# Patient Record
Sex: Female | Born: 1975 | Race: White | Hispanic: No | Marital: Married | Smoking: Never smoker
Health system: Southern US, Community
[De-identification: ages and names within clinical notes are randomized; demographics above are authoritative.]

## PROBLEM LIST (undated history)

## (undated) DIAGNOSIS — N390 Urinary tract infection, site not specified: Secondary | ICD-10-CM

## (undated) DIAGNOSIS — E079 Disorder of thyroid, unspecified: Secondary | ICD-10-CM

## (undated) HISTORY — DX: Disorder of thyroid, unspecified: E07.9

---

## 1998-11-25 ENCOUNTER — Other Ambulatory Visit: Admission: RE | Admit: 1998-11-25 | Discharge: 1998-11-25 | Payer: Self-pay | Admitting: Gynecology

## 1999-07-22 ENCOUNTER — Other Ambulatory Visit: Admission: RE | Admit: 1999-07-22 | Discharge: 1999-07-22 | Payer: Self-pay | Admitting: Obstetrics and Gynecology

## 2000-05-19 ENCOUNTER — Ambulatory Visit (HOSPITAL_COMMUNITY): Admission: RE | Admit: 2000-05-19 | Discharge: 2000-05-19 | Payer: Self-pay | Admitting: Obstetrics and Gynecology

## 2000-05-19 ENCOUNTER — Encounter: Payer: Self-pay | Admitting: Obstetrics and Gynecology

## 2000-07-21 ENCOUNTER — Ambulatory Visit (HOSPITAL_COMMUNITY): Admission: RE | Admit: 2000-07-21 | Discharge: 2000-07-21 | Payer: Self-pay | Admitting: Obstetrics and Gynecology

## 2000-08-14 ENCOUNTER — Encounter: Payer: Self-pay | Admitting: Obstetrics and Gynecology

## 2000-08-14 ENCOUNTER — Ambulatory Visit (HOSPITAL_COMMUNITY): Admission: RE | Admit: 2000-08-14 | Discharge: 2000-08-14 | Payer: Self-pay | Admitting: Obstetrics and Gynecology

## 2000-10-02 ENCOUNTER — Inpatient Hospital Stay (HOSPITAL_COMMUNITY): Admission: AD | Admit: 2000-10-02 | Discharge: 2000-10-02 | Payer: Self-pay | Admitting: Obstetrics and Gynecology

## 2000-10-03 ENCOUNTER — Inpatient Hospital Stay (HOSPITAL_COMMUNITY): Admission: AD | Admit: 2000-10-03 | Discharge: 2000-10-06 | Payer: Self-pay | Admitting: Obstetrics and Gynecology

## 2000-10-08 ENCOUNTER — Encounter: Admission: RE | Admit: 2000-10-08 | Discharge: 2000-11-09 | Payer: Self-pay | Admitting: Obstetrics and Gynecology

## 2000-11-13 ENCOUNTER — Other Ambulatory Visit: Admission: RE | Admit: 2000-11-13 | Discharge: 2000-11-13 | Payer: Self-pay | Admitting: Obstetrics and Gynecology

## 2001-11-28 ENCOUNTER — Other Ambulatory Visit: Admission: RE | Admit: 2001-11-28 | Discharge: 2001-11-28 | Payer: Self-pay | Admitting: Obstetrics and Gynecology

## 2002-12-31 ENCOUNTER — Other Ambulatory Visit: Admission: RE | Admit: 2002-12-31 | Discharge: 2002-12-31 | Payer: Self-pay | Admitting: Obstetrics and Gynecology

## 2003-04-11 ENCOUNTER — Ambulatory Visit (HOSPITAL_COMMUNITY): Admission: RE | Admit: 2003-04-11 | Discharge: 2003-04-11 | Payer: Self-pay | Admitting: Obstetrics and Gynecology

## 2003-04-11 ENCOUNTER — Encounter: Payer: Self-pay | Admitting: Obstetrics and Gynecology

## 2003-08-19 ENCOUNTER — Inpatient Hospital Stay (HOSPITAL_COMMUNITY): Admission: AD | Admit: 2003-08-19 | Discharge: 2003-08-21 | Payer: Self-pay | Admitting: Obstetrics and Gynecology

## 2004-06-03 ENCOUNTER — Other Ambulatory Visit: Admission: RE | Admit: 2004-06-03 | Discharge: 2004-06-03 | Payer: Self-pay | Admitting: Obstetrics and Gynecology

## 2005-08-10 ENCOUNTER — Other Ambulatory Visit: Admission: RE | Admit: 2005-08-10 | Discharge: 2005-08-10 | Payer: Self-pay | Admitting: Obstetrics and Gynecology

## 2006-01-19 ENCOUNTER — Inpatient Hospital Stay (HOSPITAL_COMMUNITY): Admission: AD | Admit: 2006-01-19 | Discharge: 2006-01-19 | Payer: Self-pay | Admitting: Obstetrics and Gynecology

## 2006-01-25 ENCOUNTER — Ambulatory Visit (HOSPITAL_COMMUNITY): Admission: RE | Admit: 2006-01-25 | Discharge: 2006-01-25 | Payer: Self-pay | Admitting: Obstetrics and Gynecology

## 2006-02-16 ENCOUNTER — Inpatient Hospital Stay (HOSPITAL_COMMUNITY): Admission: AD | Admit: 2006-02-16 | Discharge: 2006-02-18 | Payer: Self-pay | Admitting: Obstetrics and Gynecology

## 2013-02-11 ENCOUNTER — Other Ambulatory Visit: Payer: Self-pay

## 2013-02-11 MED ORDER — LEVOTHYROXINE SODIUM 112 MCG PO TABS: 112.0000 ug | ORAL_TABLET | Freq: Every day | ORAL | Status: DC

## 2013-02-11 NOTE — Telephone Encounter (Signed)
No thyroid labs in our chart  Last seen 12/13

## 2013-09-12 ENCOUNTER — Encounter: Payer: Self-pay | Admitting: Nurse Practitioner

## 2013-09-12 ENCOUNTER — Ambulatory Visit (INDEPENDENT_AMBULATORY_CARE_PROVIDER_SITE_OTHER): Payer: BC Managed Care – PPO | Admitting: Nurse Practitioner

## 2013-09-12 ENCOUNTER — Encounter (INDEPENDENT_AMBULATORY_CARE_PROVIDER_SITE_OTHER): Payer: Self-pay

## 2013-09-12 ENCOUNTER — Telehealth: Payer: Self-pay | Admitting: Nurse Practitioner

## 2013-09-12 VITALS — BP 151/89 | HR 87 | Temp 97.9°F | Ht 64.0 in | Wt 177.0 lb

## 2013-09-12 DIAGNOSIS — J209 Acute bronchitis, unspecified: Secondary | ICD-10-CM

## 2013-09-12 MED ORDER — HYDROCODONE-HOMATROPINE 5-1.5 MG/5ML PO SYRP: 5.0000 mL | ORAL_SOLUTION | Freq: Three times a day (TID) | ORAL | Status: DC | PRN

## 2013-09-12 MED ORDER — AZITHROMYCIN 250 MG PO TABS: ORAL_TABLET | ORAL | Status: DC

## 2013-09-12 NOTE — Patient Instructions (Signed)

## 2013-09-12 NOTE — Telephone Encounter (Signed)
Appt scheduled for afternoon clinic.

## 2013-09-12 NOTE — Progress Notes (Signed)
   Subjective:    Patient ID: Brooke Schroeder, female    DOB: [birth year], 37 y.o.   MRN: [id number]  HPI Patient in tonight c/o cough and congestion- started about 2 weeks ago- cough no better- OTC delsm and allegra d-    Review of Systems  Constitutional: Negative for fever, chills, appetite change and fatigue.  HENT: Positive for congestion, rhinorrhea and sinus pressure.   Respiratory: Positive for cough (nonproductive).   Cardiovascular: Negative.   All other systems reviewed and are negative.       Objective:   Physical Exam  Constitutional: She is oriented to person, place, and time. She appears well-developed and well-nourished.  HENT:  Right Ear: Hearing, tympanic membrane, external ear and ear canal normal.  Left Ear: Hearing, tympanic membrane, external ear and ear canal normal.  Nose: Mucosal edema and rhinorrhea present. Right sinus exhibits no maxillary sinus tenderness and no frontal sinus tenderness. Left sinus exhibits no frontal sinus tenderness.  Mouth/Throat: Oropharynx is clear and moist and mucous membranes are normal.  Cardiovascular: Normal rate, regular rhythm and normal heart sounds.   Pulmonary/Chest: Effort normal and breath sounds normal.  Neurological: She is alert and oriented to person, place, and time.  Skin: Skin is warm and dry.    BP 151/89  Pulse 87  Temp(Src) 97.9 F (36.6 C) (Oral)  Ht 5\' 4"  (1.626 m)  Wt 177 lb (80.287 kg)  BMI 30.37 kg/m2  LMP 09/12/2013       Assessment & Plan:   1. Acute bronchitis    Meds ordered this encounter  Medications  . azithromycin (ZITHROMAX Z-PAK) 250 MG tablet    Sig: As directed    Dispense:  6 each    Refill:  0    Order Specific Question:  Supervising Provider    Answer:  Ernestina Penna [[id number]]  . HYDROcodone-homatropine (HYCODAN) 5-1.5 MG/5ML syrup    Sig: Take 5 mLs by mouth every 8 (eight) hours as needed for cough.    Dispense:  120 mL    Refill:  0    Order Specific Question:   Supervising Provider    Answer:  Ernestina Penna [[id number]]   1. Take meds as prescribed 2. Use a cool mist humidifier especially during the winter months and when heat has been humid. 3. Use saline nose sprays frequently 4. Saline irrigations of the nose can be very helpful if done frequently.  * 4X daily for 1 week*  * Use of a nettie pot can be helpful with this. Follow directions with this* 5. Drink plenty of fluids 6. Keep thermostat turn down low 7.For any cough or congestion  Use plain Mucinex- regular strength or max strength is fine   * Children- consult with Pharmacist for dosing 8. For fever or aces or pains- take tylenol or ibuprofen appropriate for age and weight.  * for fevers greater than 101 orally you may alternate ibuprofen and tylenol every  3 hours. AVOID DECONGESTANTS OTC due to blood pressure!!!   Brooke Daphine Deutscher, FNP

## 2013-10-17 ENCOUNTER — Encounter: Payer: Self-pay | Admitting: Family Medicine

## 2013-10-17 ENCOUNTER — Ambulatory Visit (INDEPENDENT_AMBULATORY_CARE_PROVIDER_SITE_OTHER): Payer: BC Managed Care – PPO | Admitting: Family Medicine

## 2013-10-17 VITALS — BP 140/91 | HR 89 | Temp 98.6°F | Ht 64.0 in | Wt 177.4 lb

## 2013-10-17 DIAGNOSIS — J111 Influenza due to unidentified influenza virus with other respiratory manifestations: Secondary | ICD-10-CM

## 2013-10-17 MED ORDER — AZITHROMYCIN 250 MG PO TABS: ORAL_TABLET | ORAL | Status: DC

## 2013-10-17 MED ORDER — OSELTAMIVIR PHOSPHATE 75 MG PO CAPS: 75.0000 mg | ORAL_CAPSULE | Freq: Two times a day (BID) | ORAL | Status: DC

## 2013-10-17 MED ORDER — HYDROCODONE-HOMATROPINE 5-1.5 MG/5ML PO SYRP: 5.0000 mL | ORAL_SOLUTION | Freq: Three times a day (TID) | ORAL | Status: DC | PRN

## 2013-10-17 NOTE — Progress Notes (Signed)
   Subjective:    Patient ID: Katha HammingStaci B Haris, female    DOB: 1976/03/09, 38 y.o.   MRN: [id number]  HPI This 38 y.o. female presents for evaluation of URI sx's, arthralgias, myalgias, fever, and malaise.   Review of Systems C/o uri sx's, arthralgias, myalgias, fever, and malaise. No chest pain, SOB, HA, dizziness, vision change, N/V, diarrhea, constipation, dysuria, urinary urgency or frequency, myalgias, arthralgias or rash.     Objective:   Physical Exam Vital signs noted  Well developed well nourished female.  HEENT - Head atraumatic Normocephalic                Eyes - PERRLA, Conjuctiva - clear Sclera- Clear EOMI                Ears - EAC's Wnl TM's Wnl Gross Hearing WNL                Nose - Nares patent                 Throat - oropharanx wnl Respiratory - Lungs CTA bilateral Cardiac - RRR S1 and S2 without murmur GI - Abdomen soft Nontender and bowel sounds active x 4 Extremities - No edema. Neuro - Grossly intact.       Assessment & Plan:  Influenza - Plan: azithromycin (ZITHROMAX Z-PAK) 250 MG tablet, HYDROcodone-homatropine (HYCODAN) 5-1.5 MG/5ML syrup, oseltamivir (TAMIFLU) 75 MG capsule  Push po fluids, rest, tylenol and motrin otc prn as directed for fever, arthralgias, and myalgias.  Follow up prn if sx's continue or persist.  Deatra CanterWilliam J Afrika Brick FNP

## 2014-01-03 ENCOUNTER — Telehealth: Payer: Self-pay | Admitting: Nurse Practitioner

## 2014-01-03 NOTE — Telephone Encounter (Signed)
No appointments available today. Patient is ok with scheduling for tomorrow morning. She wasn't available when I returned her call but her husband relayed this information. He will tell her the appointment time and she will call back if this doesn't work for her.

## 2014-01-04 ENCOUNTER — Ambulatory Visit (INDEPENDENT_AMBULATORY_CARE_PROVIDER_SITE_OTHER): Payer: BC Managed Care – PPO | Admitting: Nurse Practitioner

## 2014-01-04 VITALS — BP 137/84 | HR 76 | Temp 97.0°F | Ht 64.0 in | Wt 178.0 lb

## 2014-01-04 DIAGNOSIS — N390 Urinary tract infection, site not specified: Secondary | ICD-10-CM

## 2014-01-04 DIAGNOSIS — R300 Dysuria: Secondary | ICD-10-CM

## 2014-01-04 DIAGNOSIS — R309 Painful micturition, unspecified: Secondary | ICD-10-CM

## 2014-01-04 LAB — POCT UA - MICROSCOPIC ONLY
CRYSTALS, UR, HPF, POC: NEGATIVE
Casts, Ur, LPF, POC: NEGATIVE
MUCUS UA: NEGATIVE
Yeast, UA: NEGATIVE

## 2014-01-04 LAB — POCT URINALYSIS DIPSTICK
Bilirubin, UA: NEGATIVE
GLUCOSE UA: NEGATIVE
Ketones, UA: NEGATIVE
Nitrite, UA: NEGATIVE
PROTEIN UA: NEGATIVE
SPEC GRAV UA: 1.015
Urobilinogen, UA: NEGATIVE
pH, UA: 6.5

## 2014-01-04 MED ORDER — NITROFURANTOIN MONOHYD MACRO 100 MG PO CAPS: 100.0000 mg | ORAL_CAPSULE | Freq: Two times a day (BID) | ORAL | Status: DC

## 2014-01-04 NOTE — Progress Notes (Signed)
   Subjective:    Patient ID: Brooke Schroeder, female    DOB: April 30, 1976, 38 y.o.   MKatha HammingN: [id number]  HPI Patient in c/o dysuria and frequency- had to get up several times in the middle of night to void.- started Thursday and has worsened.    Review of Systems  Constitutional: Negative for fever and chills.  Respiratory: Negative.   Cardiovascular: Negative.   Genitourinary: Positive for dysuria, urgency and frequency.  All other systems reviewed and are negative.      Objective:   Physical Exam  Constitutional: She appears well-developed and well-nourished.  Cardiovascular: Normal rate, regular rhythm and normal heart sounds.   Pulmonary/Chest: Effort normal and breath sounds normal.  Abdominal: Soft. Bowel sounds are normal. There is no tenderness.  Genitourinary:  No CAV tenderness bil  Skin: Skin is warm.  Psychiatric: She has a normal mood and affect. Her behavior is normal. Judgment and thought content normal.   BP 137/84  Pulse 76  Temp(Src) 97 F (36.1 C) (Oral)  Ht 5\' 4"  (1.626 m)  Wt 178 lb (80.74 kg)  BMI 30.54 kg/m2  LMP 12/13/2013        Assessment & Plan:   1. Painful urination   2. UTI (urinary tract infection)    Meds ordered this encounter  Medications  . nitrofurantoin, macrocrystal-monohydrate, (MACROBID) 100 MG capsule    Sig: Take 1 capsule (100 mg total) by mouth 2 (two) times daily.    Dispense:  14 capsule    Refill:  0    Order Specific Question:  Supervising Provider    Answer:  Deborra MedinaMOORE, DONALD W [[id number]]   Force fluids AZO over the counter X2 days RTO prn Culture pending  Mary-Margaret Daphine DeutscherMartin, FNP

## 2014-01-04 NOTE — Patient Instructions (Signed)
Urinary Tract Infection  Urinary tract infections (UTIs) can develop anywhere along your urinary tract. Your urinary tract is your body's drainage system for removing wastes and extra water. Your urinary tract includes two kidneys, two ureters, a bladder, and a urethra. Your kidneys are a pair of bean-shaped organs. Each kidney is about the size of your fist. They are located below your ribs, one on each side of your spine.  CAUSES  Infections are caused by microbes, which are microscopic organisms, including fungi, viruses, and bacteria. These organisms are so small that they can only be seen through a microscope. Bacteria are the microbes that most commonly cause UTIs.  SYMPTOMS   Symptoms of UTIs may vary by age and gender of the patient and by the location of the infection. Symptoms in young women typically include a frequent and intense urge to urinate and a painful, burning feeling in the bladder or urethra during urination. Older women and men are more likely to be tired, shaky, and weak and have muscle aches and abdominal pain. A fever may mean the infection is in your kidneys. Other symptoms of a kidney infection include pain in your back or sides below the ribs, nausea, and vomiting.  DIAGNOSIS  To diagnose a UTI, your caregiver will ask you about your symptoms. Your caregiver also will ask to provide a urine sample. The urine sample will be tested for bacteria and white blood cells. White blood cells are made by your body to help fight infection.  TREATMENT   Typically, UTIs can be treated with medication. Because most UTIs are caused by a bacterial infection, they usually can be treated with the use of antibiotics. The choice of antibiotic and length of treatment depend on your symptoms and the type of bacteria causing your infection.  HOME CARE INSTRUCTIONS   If you were prescribed antibiotics, take them exactly as your caregiver instructs you. Finish the medication even if you feel better after you  have only taken some of the medication.   Drink enough water and fluids to keep your urine clear or pale yellow.   Avoid caffeine, tea, and carbonated beverages. They tend to irritate your bladder.   Empty your bladder often. Avoid holding urine for long periods of time.   Empty your bladder before and after sexual intercourse.   After a bowel movement, women should cleanse from front to back. Use each tissue only once.  SEEK MEDICAL CARE IF:    You have back pain.   You develop a fever.   Your symptoms do not begin to resolve within 3 days.  SEEK IMMEDIATE MEDICAL CARE IF:    You have severe back pain or lower abdominal pain.   You develop chills.   You have nausea or vomiting.   You have continued burning or discomfort with urination.  MAKE SURE YOU:    Understand these instructions.   Will watch your condition.   Will get help right away if you are not doing well or get worse.  Document Released: 06/22/2005 Document Revised: 03/13/2012 Document Reviewed: 10/21/2011  [employer] Patient Information 2014 [employer].

## 2014-06-21 ENCOUNTER — Ambulatory Visit (INDEPENDENT_AMBULATORY_CARE_PROVIDER_SITE_OTHER): Payer: BC Managed Care – PPO | Admitting: Family Medicine

## 2014-06-21 VITALS — BP 133/87 | HR 86 | Temp 97.5°F | Ht 64.0 in | Wt 180.2 lb

## 2014-06-21 DIAGNOSIS — H109 Unspecified conjunctivitis: Secondary | ICD-10-CM

## 2014-06-21 MED ORDER — SULFACETAMIDE SODIUM 10 % OP SOLN: 1.0000 [drp] | OPHTHALMIC | Status: DC

## 2014-06-21 NOTE — Progress Notes (Signed)
   Subjective:    Patient ID: Brooke Schroeder, female    DOB: Mar 03, 1976, 37 y.o.   MRN: [id number]  HPI  C/o bilateral eye drainage and itching.  Review of Systems No chest pain, SOB, HA, dizziness, vision change, N/V, diarrhea, constipation, dysuria, urinary urgency or frequency, myalgias, arthralgias or rash.     Objective:   Physical Exam  Eyes - bilateral conjuctiva injected and swollen with exudate      Assessment & Plan:  Conjunctivitis, unspecified laterality - Plan: sulfacetamide (BLEPH-10) 10 % ophthalmic solution q3 hours w/a OU  Deatra Canter FNP

## 2014-09-20 ENCOUNTER — Encounter (HOSPITAL_COMMUNITY): Payer: Self-pay | Admitting: Emergency Medicine

## 2014-09-20 DIAGNOSIS — R197 Diarrhea, unspecified: Secondary | ICD-10-CM | POA: Diagnosis not present

## 2014-09-20 DIAGNOSIS — R112 Nausea with vomiting, unspecified: Secondary | ICD-10-CM | POA: Insufficient documentation

## 2014-09-20 DIAGNOSIS — Z87442 Personal history of urinary calculi: Secondary | ICD-10-CM | POA: Diagnosis not present

## 2014-09-20 DIAGNOSIS — E079 Disorder of thyroid, unspecified: Secondary | ICD-10-CM | POA: Insufficient documentation

## 2014-09-20 DIAGNOSIS — Z79899 Other long term (current) drug therapy: Secondary | ICD-10-CM | POA: Diagnosis not present

## 2014-09-20 DIAGNOSIS — Z3202 Encounter for pregnancy test, result negative: Secondary | ICD-10-CM | POA: Insufficient documentation

## 2014-09-20 DIAGNOSIS — R1084 Generalized abdominal pain: Secondary | ICD-10-CM | POA: Insufficient documentation

## 2014-09-20 DIAGNOSIS — M545 Low back pain: Secondary | ICD-10-CM | POA: Insufficient documentation

## 2014-09-20 DIAGNOSIS — R1030 Lower abdominal pain, unspecified: Secondary | ICD-10-CM | POA: Diagnosis present

## 2014-09-20 LAB — COMPREHENSIVE METABOLIC PANEL
ALK PHOS: 80 U/L (ref 39–117)
ALT: 17 U/L (ref 0–35)
ANION GAP: 8 (ref 5–15)
AST: 23 U/L (ref 0–37)
Albumin: 4.2 g/dL (ref 3.5–5.2)
BILIRUBIN TOTAL: 0.6 mg/dL (ref 0.3–1.2)
BUN: 10 mg/dL (ref 6–23)
CO2: 26 mmol/L (ref 19–32)
Calcium: 8.8 mg/dL (ref 8.4–10.5)
Chloride: 103 mEq/L (ref 96–112)
Creatinine, Ser: 0.95 mg/dL (ref 0.50–1.10)
GFR, EST AFRICAN AMERICAN: 86 mL/min — AB (ref >90)
GFR, EST NON AFRICAN AMERICAN: 74 mL/min — AB (ref >90)
GLUCOSE: 147 mg/dL — AB (ref 70–99)
POTASSIUM: 3.8 mmol/L (ref 3.5–5.1)
Sodium: 137 mmol/L (ref 135–145)
TOTAL PROTEIN: 7.3 g/dL (ref 6.0–8.3)

## 2014-09-20 LAB — CBC WITH DIFFERENTIAL/PLATELET
Basophils Absolute: 0 10*3/uL (ref 0.0–0.1)
Basophils Relative: 0 % (ref 0–1)
EOS ABS: 0 10*3/uL (ref 0.0–0.7)
Eosinophils Relative: 0 % (ref 0–5)
HCT: 39.4 % (ref 36.0–46.0)
HEMOGLOBIN: 14 g/dL (ref 12.0–15.0)
LYMPHS ABS: 0.9 10*3/uL (ref 0.7–4.0)
LYMPHS PCT: 16 % (ref 12–46)
MCH: 30.3 pg (ref 26.0–34.0)
MCHC: 35.5 g/dL (ref 30.0–36.0)
MCV: 85.3 fL (ref 78.0–100.0)
MONOS PCT: 8 % (ref 3–12)
Monocytes Absolute: 0.5 10*3/uL (ref 0.1–1.0)
NEUTROS PCT: 76 % (ref 43–77)
Neutro Abs: 4.4 10*3/uL (ref 1.7–7.7)
Platelets: 161 10*3/uL (ref 150–400)
RBC: 4.62 MIL/uL (ref 3.87–5.11)
RDW: 11.7 % (ref 11.5–15.5)
WBC: 5.8 10*3/uL (ref 4.0–10.5)

## 2014-09-20 NOTE — ED Notes (Signed)
Pt. reports low abdominal pain and low back pain with emesis Barron Alvine/diarrhea Isabelle Course/chills and body aches onset yesterday .

## 2014-09-21 ENCOUNTER — Emergency Department (HOSPITAL_COMMUNITY)
Admission: EM | Admit: 2014-09-21 | Discharge: 2014-09-21 | Disposition: A | Discharge status: Home / Self Care | Payer: BC Managed Care – PPO | Attending: Emergency Medicine | Admitting: Emergency Medicine

## 2014-09-21 ENCOUNTER — Emergency Department (HOSPITAL_COMMUNITY): Payer: BC Managed Care – PPO

## 2014-09-21 ENCOUNTER — Encounter (HOSPITAL_COMMUNITY): Payer: Self-pay

## 2014-09-21 DIAGNOSIS — R1084 Generalized abdominal pain: Secondary | ICD-10-CM | POA: Diagnosis not present

## 2014-09-21 DIAGNOSIS — R109 Unspecified abdominal pain: Secondary | ICD-10-CM

## 2014-09-21 HISTORY — DX: Urinary tract infection, site not specified: N39.0

## 2014-09-21 LAB — URINALYSIS, ROUTINE W REFLEX MICROSCOPIC
Bilirubin Urine: NEGATIVE
GLUCOSE, UA: NEGATIVE mg/dL
Glucose, UA: NEGATIVE mg/dL
Hgb urine dipstick: NEGATIVE
Hgb urine dipstick: NEGATIVE
KETONES UR: 15 mg/dL — AB
Ketones, ur: NEGATIVE mg/dL
LEUKOCYTES UA: NEGATIVE
Nitrite: NEGATIVE
Nitrite: NEGATIVE
PH: 5.5 (ref 5.0–8.0)
PROTEIN: NEGATIVE mg/dL
Protein, ur: NEGATIVE mg/dL
SPECIFIC GRAVITY, URINE: 1.035 — AB (ref 1.005–1.030)
Specific Gravity, Urine: 1.025 (ref 1.005–1.030)
UROBILINOGEN UA: 1 mg/dL (ref 0.0–1.0)
UROBILINOGEN UA: 0.2 mg/dL (ref 0.0–1.0)
pH: 5 (ref 5.0–8.0)

## 2014-09-21 LAB — URINE MICROSCOPIC-ADD ON

## 2014-09-21 LAB — PREGNANCY, URINE: Preg Test, Ur: NEGATIVE

## 2014-09-21 MED ORDER — HYOSCYAMINE SULFATE 0.125 MG SL SUBL: 0.1250 mg | SUBLINGUAL_TABLET | Freq: Four times a day (QID) | SUBLINGUAL | Status: DC | PRN

## 2014-09-21 MED ORDER — SODIUM CHLORIDE 0.9 % IV BOLUS (SEPSIS)
1000.0000 mL | Freq: Once | INTRAVENOUS | Status: AC
  Administered 2014-09-21: 1000 mL via INTRAVENOUS

## 2014-09-21 MED ORDER — IOHEXOL 300 MG/ML  SOLN
100.0000 mL | Freq: Once | INTRAMUSCULAR | Status: AC | PRN
  Administered 2014-09-21: 100 mL via INTRAVENOUS

## 2014-09-21 MED ORDER — HYOSCYAMINE SULFATE 0.125 MG PO TABS
0.1250 mg | ORAL_TABLET | Freq: Once | ORAL | Status: AC
  Administered 2014-09-21: 0.125 mg via ORAL
  Filled 2014-09-21: qty 1

## 2014-09-21 MED ORDER — ONDANSETRON HCL 4 MG/2ML IJ SOLN
4.0000 mg | Freq: Once | INTRAMUSCULAR | Status: AC
  Administered 2014-09-21: 4 mg via INTRAVENOUS
  Filled 2014-09-21: qty 2

## 2014-09-21 MED ORDER — HYDROMORPHONE HCL 1 MG/ML IJ SOLN
1.0000 mg | Freq: Once | INTRAMUSCULAR | Status: AC
  Administered 2014-09-21: 1 mg via INTRAVENOUS
  Filled 2014-09-21: qty 1

## 2014-09-21 MED ORDER — ONDANSETRON HCL 4 MG PO TABS: 4.0000 mg | ORAL_TABLET | Freq: Four times a day (QID) | ORAL | Status: DC

## 2014-09-21 MED ORDER — IOHEXOL 300 MG/ML  SOLN
25.0000 mL | Freq: Once | INTRAMUSCULAR | Status: AC | PRN
  Administered 2014-09-21: 25 mL via ORAL

## 2014-09-21 NOTE — Discharge Instructions (Signed)
You can stop taking your antibiotic.  The infection in your urine has now resolved.      Abdominal Pain, Women Abdominal (stomach, pelvic, or belly) pain can be caused by many things. It is important to tell your doctor:  The location of the pain.  Does it come and go or is it present all the time?  Are there things that start the pain (eating certain foods, exercise)?  Are there other symptoms associated with the pain (fever, nausea, vomiting, diarrhea)? All of this is helpful to know when trying to find the cause of the pain. CAUSES   Stomach: virus or bacteria infection, or ulcer.  Intestine: appendicitis (inflamed appendix), regional ileitis (Crohn's disease), ulcerative colitis (inflamed colon), irritable bowel syndrome, diverticulitis (inflamed diverticulum of the colon), or cancer of the stomach or intestine.  Gallbladder disease or stones in the gallbladder.  Kidney disease, kidney stones, or infection.  Pancreas infection or cancer.  Fibromyalgia (pain disorder).  Diseases of the female organs:  Uterus: fibroid (non-cancerous) tumors or infection.  Fallopian tubes: infection or tubal pregnancy.  Ovary: cysts or tumors.  Pelvic adhesions (scar tissue).  Endometriosis (uterus lining tissue growing in the pelvis and on the pelvic organs).  Pelvic congestion syndrome (female organs filling up with blood just before the menstrual period).  Pain with the menstrual period.  Pain with ovulation (producing an egg).  Pain with an IUD (intrauterine device, birth control) in the uterus.  Cancer of the female organs.  Functional pain (pain not caused by a disease, may improve without treatment).  Psychological pain.  Depression. DIAGNOSIS  Your doctor will decide the seriousness of your pain by doing an examination.  Blood tests.  X-rays.  Ultrasound.  CT scan (computed tomography, special type of X-ray).  MRI (magnetic resonance imaging).  Cultures,  for infection.  Barium enema (dye inserted in the large intestine, to better view it with X-rays).  Colonoscopy (looking in intestine with a lighted tube).  Laparoscopy (minor surgery, looking in abdomen with a lighted tube).  Major abdominal exploratory surgery (looking in abdomen with a large incision). TREATMENT  The treatment will depend on the cause of the pain.   Many cases can be observed and treated at home.  Over-the-counter medicines recommended by your caregiver.  Prescription medicine.  Antibiotics, for infection.  Birth control pills, for painful periods or for ovulation pain.  Hormone treatment, for endometriosis.  Nerve blocking injections.  Physical therapy.  Antidepressants.  Counseling with a psychologist or psychiatrist.  Minor or major surgery. HOME CARE INSTRUCTIONS   Do not take laxatives, unless directed by your caregiver.  Take over-the-counter pain medicine only if ordered by your caregiver. Do not take aspirin because it can cause an upset stomach or bleeding.  Try a clear liquid diet (broth or water) as ordered by your caregiver. Slowly move to a bland diet, as tolerated, if the pain is related to the stomach or intestine.  Have a thermometer and take your temperature several times a day, and record it.  Bed rest and sleep, if it helps the pain.  Avoid sexual intercourse, if it causes pain.  Avoid stressful situations.  Keep your follow-up appointments and tests, as your caregiver orders.  If the pain does not go away with medicine or surgery, you may try:  Acupuncture.  Relaxation exercises (yoga, meditation).  Group therapy.  Counseling. SEEK MEDICAL CARE IF:   You notice certain foods cause stomach pain.  Your home care treatment is not  helping your pain.  You need stronger pain medicine.  You want your IUD removed.  You feel faint or lightheaded.  You develop nausea and vomiting.  You develop a rash.  You are  having side effects or an allergy to your medicine. SEEK IMMEDIATE MEDICAL CARE IF:   Your pain does not go away or gets worse.  You have a fever.  Your pain is felt only in portions of the abdomen. The right side could possibly be appendicitis. The left lower portion of the abdomen could be colitis or diverticulitis.  You are passing blood in your stools (bright red or black tarry stools, with or without vomiting).  You have blood in your urine.  You develop chills, with or without a fever.  You pass out. MAKE SURE YOU:   Understand these instructions.  Will watch your condition.  Will get help right away if you are not doing well or get worse. Document Released: 07/10/2007 Document Revised: 01/27/2014 Document Reviewed: 07/30/2009 [facility] Patient Information 2015 [place], [place]. This information is not intended to replace advice given to you by your health care provider. Make sure you discuss any questions you have with your health care provider.

## 2014-09-21 NOTE — ED Provider Notes (Signed)
CSN: [id number]     Arrival date & time 09/20/14  2220 History  This chart was scribed for Tilden FossaElizabeth Lekha Dancer, MD by Bronson CurbJacqueline Melvin, ED Scribe. This patient was seen in room A07C/A07C and the patient's care was started at 3:06 AM.   Chief Complaint  Patient presents with  . Abdominal Pain    The history is provided by the patient. No language interpreter was used.   HPI Comments: Brooke Schroeder is a 38 y.o. female, with history of thyroid disease, who presents to the Emergency Department complaining of gradually worsening, constant, lower abdominal pain onset 2 days ago. There is associated nausea, vomiting, 2-3 episodes of diarrhea. She also notes lower back that is worse on the right toward the middle. No aggravating or alleviating factors. Patient was recently diagnosed with a UTI and was placed on ABX. She is currently on Synthroid and denies history of abdominal surgeries. She denies fever. Symptoms are moderate.   Past Medical History  Diagnosis Date  . Thyroid disease   . UTI (lower urinary tract infection)    History reviewed. No pertinent past surgical history. No family history on file. History  Substance Use Topics  . Smoking status: Never Smoker   . Smokeless tobacco: Not on file  . Alcohol Use: No   OB History    No data available     Review of Systems  Constitutional: Negative for fever.  Gastrointestinal: Positive for nausea, vomiting, abdominal pain and diarrhea.  Musculoskeletal: Positive for back pain.  All other systems reviewed and are negative.     Allergies  Review of patient's allergies indicates no known allergies.  Home Medications   Prior to Admission medications   Medication Sig Start Date End Date Taking? Authorizing Provider  cetirizine (ZYRTEC) 10 MG tablet Take 10 mg by mouth daily.    Historical Provider, MD  levothyroxine (SYNTHROID, LEVOTHROID) 112 MCG tablet Take 1 tablet (112 mcg total) by mouth daily. 02/11/13   Mary-Margaret Daphine DeutscherMartin,  FNP  sulfacetamide (BLEPH-10) 10 % ophthalmic solution Place 1 drop into both eyes every 3 (three) hours while awake. 06/21/14   Deatra CanterWilliam J Oxford, FNP   Triage Vitals: BP 125/80 mmHg  Pulse 98  Temp(Src) 98.5 F (36.9 C) (Oral)  Resp 18  Ht 5\' 4"  (1.626 m)  Wt 190 lb (86.183 kg)  BMI 32.60 kg/m2  SpO2 98%  LMP 09/01/2014  Physical Exam  Constitutional: She is oriented to person, place, and time. She appears well-developed and well-nourished.  HENT:  Head: Normocephalic and atraumatic.  Cardiovascular: Normal rate and regular rhythm.   No murmur heard. Pulmonary/Chest: Effort normal and breath sounds normal. No respiratory distress.  Abdominal: Soft. There is tenderness. There is no rebound and no guarding.  Mild diffuse abdominal tenderness. No rebound or guarding.  Musculoskeletal: She exhibits no edema or tenderness.  Neurological: She is alert and oriented to person, place, and time.  Skin: Skin is warm and dry.  Psychiatric: She has a normal mood and affect. Her behavior is normal.  Nursing note and vitals reviewed.   ED Course  Procedures (including critical care time)  DIAGNOSTIC STUDIES: Oxygen Saturation is 98% on room air, normal by my interpretation.    COORDINATION OF CARE: At 0310 Discussed treatment plan with patient which includes IV fluids and pain medication. Patient agrees.   Labs Review Labs Reviewed  COMPREHENSIVE METABOLIC PANEL - Abnormal; Notable for the following:    Glucose, Bld 147 (*)    GFR calc  non Af Amer 74 (*)    GFR calc Af Amer 86 (*)    All other components within normal limits  URINALYSIS, ROUTINE W REFLEX MICROSCOPIC - Abnormal; Notable for the following:    Color, Urine AMBER (*)    APPearance CLOUDY (*)    Bilirubin Urine SMALL (*)    Ketones, ur 15 (*)    Leukocytes, UA SMALL (*)    All other components within normal limits  URINE MICROSCOPIC-ADD ON - Abnormal; Notable for the following:    Squamous Epithelial / LPF MANY (*)     Bacteria, UA MANY (*)    All other components within normal limits  URINALYSIS, ROUTINE W REFLEX MICROSCOPIC - Abnormal; Notable for the following:    Specific Gravity, Urine 1.035 (*)    All other components within normal limits  CBC WITH DIFFERENTIAL  PREGNANCY, URINE    Imaging Review Ct Abdomen Pelvis W Contrast  09/21/2014   CLINICAL DATA:  Acute onset of lower abdominal pain and lower back pain, vomiting and diarrhea, chills and body aches. Initial encounter.  EXAM: CT ABDOMEN AND PELVIS WITH CONTRAST  TECHNIQUE: Multidetector CT imaging of the abdomen and pelvis was performed using the standard protocol following bolus administration of intravenous contrast.  CONTRAST:  100mL OMNIPAQUE IOHEXOL 300 MG/ML  SOLN  COMPARISON:  None.  FINDINGS: Minimal bibasilar atelectasis is noted.  The liver and spleen are unremarkable in appearance. The gallbladder is within normal limits. The pancreas and adrenal glands are unremarkable.  The kidneys are unremarkable in appearance. There is no evidence of hydronephrosis. No renal or ureteral stones are seen. No perinephric stranding is appreciated.  No free fluid is identified. The small bowel is unremarkable in appearance. The stomach is within normal limits. No acute vascular abnormalities are seen.  The appendix is normal in caliber, without evidence for appendicitis. The appendix is seen ending near the inferior tip of the liver. The colon is unremarkable in appearance.  The bladder is mildly distended and grossly unremarkable. The uterus is unremarkable in appearance. The ovaries are relatively symmetric. No suspicious adnexal masses are seen. No inguinal lymphadenopathy is seen.  No acute osseous abnormalities are identified. An apparent bone island is noted within the right ilium, adjacent to the right sacroiliac joint.  IMPRESSION: Unremarkable contrast-enhanced CT of the abdomen and pelvis.   Electronically Signed   By: Roanna RaiderJeffery  Chang M.D.   On:  09/21/2014 05:08     EKG Interpretation None      MDM   Final diagnoses:  Abdominal pain, unspecified abdominal location    Patient is here for evaluation of abdominal pain, recently started on Macrobid for UTI. Initial UA indeterminate for UTI based on skin contaminant. Repeat urine is not consistent with UTI. Discussed with patient discontinuing her antibiotics with PCP follow-up. Terms of abdominal pain, CT scan to rule out appendicitis was obtained, CT scan with no acute abnormalities. Discussed with patient's abdominal pain home care as well as need for PCP follow-up and return precautions. Patient was prescribed prescriptions for zofran and Levsin.  I personally performed the services described in this documentation, which was scribed in my presence. The recorded information has been reviewed and is accurate.    Tilden FossaElizabeth Benna Arno, MD 09/21/14 [phone]

## 2014-09-21 NOTE — ED Notes (Signed)
In to check on patient.  States I'm starting to have pain again and I need something.  Rating abdominal pain at 3/10 when lying still.  MD notified.

## 2014-09-21 NOTE — ED Notes (Signed)
Patient transported to CT 

## 2016-07-05 ENCOUNTER — Encounter: Payer: Self-pay | Admitting: Physician Assistant

## 2016-07-05 ENCOUNTER — Ambulatory Visit (INDEPENDENT_AMBULATORY_CARE_PROVIDER_SITE_OTHER): Payer: Managed Care, Other (non HMO) | Admitting: Physician Assistant

## 2016-07-05 VITALS — BP 126/88 | HR 72 | Temp 97.7°F | Ht 64.0 in | Wt 161.2 lb

## 2016-07-05 DIAGNOSIS — Z0000 Encounter for general adult medical examination without abnormal findings: Secondary | ICD-10-CM

## 2016-07-05 NOTE — Progress Notes (Signed)
BP 126/88   Pulse 72   Temp 97.7 F (36.5 C) (Oral)   Ht _0  (1.626 m)   Wt 161 lb 3.2 oz (73.1 kg)   BMI 27.67 kg/m    Subjective:    Patient ID: Brooke Schroeder, female    DOB: 26-Sep-1976, 40 y.o.   MRN: [id number]  HPI: Brooke Schroeder is a 40 y.o. female presenting on 07/05/2016 for Annual Exam  Patient comes in for a annual wellness exam. She denies this performed for her insurance premium. She also needs a comprehensive metabolic and lipid panel. These will be performed today. The formal be completed once the results are back. She does not have any complaints at this time with her allergies. She is having some right elbow tenderness from hitting it at work. She does use her arms regularly on her job. She states she is not having any other issues at this time. We have discussed the need for Pap smear and mammogram on a regular basis. She states she will try to have this set up.   Relevant past medical, surgical, family and social history reviewed and updated as indicated. Interim medical history since our last visit reviewed. Allergies and medications reviewed and updated. DATA REVIEWED: CHART IN EPIC  Social History   Social History  . Marital status: Married    Spouse name: N/A  . Number of children: N/A  . Years of education: N/A   Occupational History  . Not on file.   Social History Main Topics  . Smoking status: Never Smoker  . Smokeless tobacco: Never Used  . Alcohol use No  . Drug use: No  . Sexual activity: Not on file   Other Topics Concern  . Not on file   Social History Narrative  . No narrative on file    History reviewed. No pertinent surgical history.  History reviewed. No pertinent family history.  Review of Systems  Constitutional: Negative.  Negative for activity change, fatigue and fever.  HENT: Negative.   Eyes: Negative.   Respiratory: Negative.  Negative for cough.   Cardiovascular: Negative.  Negative for chest pain.    Gastrointestinal: Negative.  Negative for abdominal pain.  Endocrine: Negative.   Genitourinary: Negative.  Negative for dysuria.  Musculoskeletal: Negative.   Skin: Negative.   Neurological: Negative.       Medication List       Accurate as of 07/05/16 10:04 PM. Always use your most recent med list.          fexofenadine-pseudoephedrine 180-240 MG 24 hr tablet Commonly known as:  ALLEGRA-D 24 Take 1 tablet by mouth daily.   levothyroxine 112 MCG tablet Commonly known as:  SYNTHROID, LEVOTHROID Take 1 tablet (112 mcg total) by mouth daily.          Objective:    BP 126/88   Pulse 72   Temp 97.7 F (36.5 C) (Oral)   Ht _1  (1.626 m)   Wt 161 lb 3.2 oz (73.1 kg)   BMI 27.67 kg/m   No Known Allergies  Wt Readings from Last 3 Encounters:  07/05/16 161 lb 3.2 oz (73.1 kg)  09/20/14 190 lb (86.2 kg)  06/21/14 180 lb 3.2 oz (81.7 kg)    Physical Exam  Constitutional: She is oriented to person, place, and time. She appears well-developed and well-nourished.  HENT:  Head: Normocephalic and atraumatic.  Eyes: Conjunctivae and EOM are normal. Pupils are equal, round, and reactive to light.  Neck: Normal range of motion. Neck supple.  Cardiovascular: Normal rate, regular rhythm, normal heart sounds and intact distal pulses.   Pulmonary/Chest: Effort normal and breath sounds normal.  Abdominal: Soft. Bowel sounds are normal.  Neurological: She is alert and oriented to person, place, and time. She has normal reflexes.  Skin: Skin is warm and dry. No rash noted.  Psychiatric: She has a normal mood and affect. Her behavior is normal. Judgment and thought content normal.  Nursing note and vitals reviewed.       Assessment & Plan:   1. Wellness examination - Lipid panel - CMP14+EGFR   Continue all other maintenance medications as listed above.  Follow up plan: Return if symptoms worsen or fail to improve.   Orders Placed This Encounter  Procedures  .  Lipid panel  . ERX54+MGQQ    Educational handout given for mammogram  Terald Sleeper PA-C [facility] [address]  [place], [place] [address] [phone]   07/05/2016, 10:04 PM

## 2016-07-05 NOTE — Patient Instructions (Signed)
mammo Mammogram A mammogram is an X-ray of the breasts that is done to check for abnormal changes. This procedure can screen for and detect any changes that may suggest breast cancer. A mammogram can also identify other changes and variations in the breast, such as:  Inflammation of the breast tissue (mastitis).  An infected area that contains a collection of pus (abscess).  A fluid-filled sac (cyst).  Fibrocystic changes. This is when breast tissue becomes denser, which can make the tissue feel rope-like or uneven under the skin.  Tumors that are not cancerous (benign). LET [facility] KNOW ABOUT:  Any allergies you have.  If you have breast implants.  If you have had previous breast disease, biopsy, or surgery.  If you are breastfeeding.  Any possibility that you could be pregnant, if this applies.  If you are younger than age 42.  If you have a family history of breast cancer. RISKS AND COMPLICATIONS Generally, this is a safe procedure. However, problems may occur, including:  Exposure to radiation. Radiation levels are very low with this test.  The results being misinterpreted.  The need for further tests.  The inability of the mammogram to detect certain cancers. BEFORE THE PROCEDURE  Schedule your test about 1-2 weeks after your menstrual period. This is usually when your breasts are the least tender.  If you have had a mammogram done at a different facility in the past, get the mammogram X-rays or have them sent to your current exam facility in order to compare them.  Wash your breasts and under your arms the day of the test.  Do not wear deodorants, perfumes, lotions, or powders anywhere on your body on the day of the test.  Remove any jewelry from your neck.  Wear clothes that you can change into and out of easily. PROCEDURE  You will undress from the waist up and put on a gown.  You will stand in front of the X-ray machine.  Each breast  will be placed between two plastic or glass plates. The plates will compress your breast for a few seconds. Try to stay as relaxed as possible during the procedure. This does not cause any harm to your breasts and any discomfort you feel will be very brief.  X-rays will be taken from different angles of each breast. The procedure may vary among health care providers and hospitals. AFTER THE PROCEDURE  The mammogram will be examined by a specialist (radiologist).  You may need to repeat certain parts of the test, depending on the quality of the images. This is commonly done if the radiologist needs a better view of the breast tissue.  Ask when your test results will be ready. Make sure you get your test results.  You may resume your normal activities.   This information is not intended to replace advice given to you by your health care provider. Make sure you discuss any questions you have with your health care provider.   Document Released: 09/09/2000 Document Revised: 06/03/2015 Document Reviewed: 11/21/2014 Elsevier Interactive Patient Education [employer]2016 Elsevier Inc.

## 2016-07-06 LAB — LIPID PANEL
CHOLESTEROL TOTAL: 196 mg/dL (ref 100–199)
Chol/HDL Ratio: 2.8 ratio units (ref 0.0–4.4)
HDL: 71 mg/dL (ref >39)
LDL Calculated: 107 mg/dL — ABNORMAL HIGH (ref 0–99)
Triglycerides: 88 mg/dL (ref 0–149)
VLDL Cholesterol Cal: 18 mg/dL (ref 5–40)

## 2016-07-06 LAB — CMP14+EGFR
ALBUMIN: 4.2 g/dL (ref 3.5–5.5)
ALT: 12 IU/L (ref 0–32)
AST: 17 IU/L (ref 0–40)
Albumin/Globulin Ratio: 1.6 (ref 1.2–2.2)
Alkaline Phosphatase: 76 IU/L (ref 39–117)
BILIRUBIN TOTAL: 0.2 mg/dL (ref 0.0–1.2)
BUN / CREAT RATIO: 13 (ref 9–23)
BUN: 11 mg/dL (ref 6–24)
CHLORIDE: 102 mmol/L (ref 96–106)
CO2: 26 mmol/L (ref 18–29)
Calcium: 9 mg/dL (ref 8.7–10.2)
Creatinine, Ser: 0.86 mg/dL (ref 0.57–1.00)
GFR calc Af Amer: 96 mL/min/{1.73_m2} (ref >59)
GFR calc non Af Amer: 84 mL/min/{1.73_m2} (ref >59)
GLOBULIN, TOTAL: 2.6 g/dL (ref 1.5–4.5)
GLUCOSE: 90 mg/dL (ref 65–99)
Potassium: 4.1 mmol/L (ref 3.5–5.2)
Sodium: 139 mmol/L (ref 134–144)
Total Protein: 6.8 g/dL (ref 6.0–8.5)

## 2018-04-02 ENCOUNTER — Encounter: Payer: Self-pay | Admitting: Family Medicine

## 2018-04-10 ENCOUNTER — Other Ambulatory Visit: Payer: Self-pay | Admitting: Obstetrics and Gynecology

## 2018-04-10 DIAGNOSIS — N6489 Other specified disorders of breast: Secondary | ICD-10-CM

## 2018-04-13 ENCOUNTER — Ambulatory Visit
Admission: RE | Admit: 2018-04-13 | Discharge: 2018-04-13 | Disposition: A | Discharge status: Home / Self Care | Payer: Commercial Managed Care - PPO | Source: Ambulatory Visit | Attending: Obstetrics and Gynecology | Admitting: Obstetrics and Gynecology

## 2018-04-13 ENCOUNTER — Ambulatory Visit
Admission: RE | Admit: 2018-04-13 | Discharge: 2018-04-13 | Disposition: A | Discharge status: Home / Self Care | Payer: Private Health Insurance - Indemnity | Source: Ambulatory Visit | Attending: Obstetrics and Gynecology | Admitting: Obstetrics and Gynecology

## 2018-04-13 ENCOUNTER — Other Ambulatory Visit: Payer: Self-pay | Admitting: Obstetrics and Gynecology

## 2018-04-13 DIAGNOSIS — N6489 Other specified disorders of breast: Secondary | ICD-10-CM

## 2018-04-13 DIAGNOSIS — N63 Unspecified lump in unspecified breast: Secondary | ICD-10-CM

## 2018-06-14 ENCOUNTER — Encounter: Payer: Self-pay | Admitting: Family Medicine

## 2018-06-14 ENCOUNTER — Ambulatory Visit (INDEPENDENT_AMBULATORY_CARE_PROVIDER_SITE_OTHER): Payer: Commercial Managed Care - PPO | Admitting: Family Medicine

## 2018-06-14 VITALS — BP 140/88 | HR 78 | Temp 98.7°F | Ht 64.0 in | Wt 164.0 lb

## 2018-06-14 DIAGNOSIS — N6009 Solitary cyst of unspecified breast: Secondary | ICD-10-CM | POA: Insufficient documentation

## 2018-06-14 DIAGNOSIS — E039 Hypothyroidism, unspecified: Secondary | ICD-10-CM | POA: Insufficient documentation

## 2018-06-14 DIAGNOSIS — Z13228 Encounter for screening for other metabolic disorders: Secondary | ICD-10-CM | POA: Diagnosis not present

## 2018-06-14 DIAGNOSIS — Z13220 Encounter for screening for lipoid disorders: Secondary | ICD-10-CM | POA: Diagnosis not present

## 2018-06-14 DIAGNOSIS — Z130 Encounter for screening for diseases of the blood and blood-forming organs and certain disorders involving the immune mechanism: Secondary | ICD-10-CM | POA: Diagnosis not present

## 2018-06-14 DIAGNOSIS — Z0000 Encounter for general adult medical examination without abnormal findings: Secondary | ICD-10-CM | POA: Diagnosis not present

## 2018-06-14 DIAGNOSIS — M2550 Pain in unspecified joint: Secondary | ICD-10-CM

## 2018-06-14 LAB — CBC WITH DIFFERENTIAL/PLATELET
BASOS ABS: 0 10*3/uL (ref 0.0–0.2)
Basos: 0 %
EOS (ABSOLUTE): 0.1 10*3/uL (ref 0.0–0.4)
Eos: 1 %
HEMOGLOBIN: 13.3 g/dL (ref 11.1–15.9)
Hematocrit: 39.2 % (ref 34.0–46.6)
IMMATURE GRANS (ABS): 0 10*3/uL (ref 0.0–0.1)
Immature Granulocytes: 0 %
LYMPHS: 31 %
Lymphocytes Absolute: 1.8 10*3/uL (ref 0.7–3.1)
MCH: 28.9 pg (ref 26.6–33.0)
MCHC: 33.9 g/dL (ref 31.5–35.7)
MCV: 85 fL (ref 79–97)
MONOCYTES: 7 %
Monocytes Absolute: 0.4 10*3/uL (ref 0.1–0.9)
Neutrophils Absolute: 3.7 10*3/uL (ref 1.4–7.0)
Neutrophils: 61 %
Platelets: 204 10*3/uL (ref 150–450)
RBC: 4.6 x10E6/uL (ref 3.77–5.28)
RDW: 12.2 % — AB (ref 12.3–15.4)
WBC: 5.9 10*3/uL (ref 3.4–10.8)

## 2018-06-14 LAB — LIPID PANEL
Chol/HDL Ratio: 2.8 ratio (ref 0.0–4.4)
Cholesterol, Total: 197 mg/dL (ref 100–199)
HDL: 70 mg/dL (ref >39)
LDL Calculated: 107 mg/dL — ABNORMAL HIGH (ref 0–99)
Triglycerides: 99 mg/dL (ref 0–149)
VLDL Cholesterol Cal: 20 mg/dL (ref 5–40)

## 2018-06-14 LAB — CMP14+EGFR
ALT: 12 IU/L (ref 0–32)
AST: 17 IU/L (ref 0–40)
Albumin/Globulin Ratio: 1.9 (ref 1.2–2.2)
Albumin: 4.5 g/dL (ref 3.5–5.5)
Alkaline Phosphatase: 74 IU/L (ref 39–117)
BUN/Creatinine Ratio: 17 (ref 9–23)
BUN: 16 mg/dL (ref 6–24)
Bilirubin Total: 0.4 mg/dL (ref 0.0–1.2)
CALCIUM: 8.9 mg/dL (ref 8.7–10.2)
CO2: 23 mmol/L (ref 20–29)
Chloride: 103 mmol/L (ref 96–106)
Creatinine, Ser: 0.96 mg/dL (ref 0.57–1.00)
GFR calc non Af Amer: 72 mL/min/{1.73_m2} (ref >59)
GFR, EST AFRICAN AMERICAN: 83 mL/min/{1.73_m2} (ref >59)
GLOBULIN, TOTAL: 2.4 g/dL (ref 1.5–4.5)
Glucose: 87 mg/dL (ref 65–99)
POTASSIUM: 4.1 mmol/L (ref 3.5–5.2)
SODIUM: 142 mmol/L (ref 134–144)
TOTAL PROTEIN: 6.9 g/dL (ref 6.0–8.5)

## 2018-06-14 NOTE — Assessment & Plan Note (Signed)
Intermittent and predominate affecting wrists and feet.  We discussed use of oral NSAID or Tylenol.  If symptoms are refractory to this, may need to brace her wrists.  She is increased risk of her carpal tunnel given use of computer daily.  If need specialist going forward, she will contact me and we will send to orthopedics.

## 2018-06-14 NOTE — Patient Instructions (Signed)
You had labs performed today.  You will be contacted with the results of the labs once they are available, usually in the next 3 business days for routine lab work.   We will have flu shots at the end of the month.  Call and schedule a nursing appointment to have this done.   We will work on getting your records from your OB/GYN and endocrinologist.  Follow-up in 1 year or sooner if needed.  Health Maintenance, Female Adopting a healthy lifestyle and getting preventive care can go a long way to promote health and wellness. Talk with your health care provider about what schedule of regular examinations is right for you. This is a good chance for you to check in with your provider about disease prevention and staying healthy. In between checkups, there are plenty of things you can do on your own. Experts have done a lot of research about which lifestyle changes and preventive measures are most likely to keep you healthy. Ask your health care provider for more information. Weight and diet Eat a healthy diet  Be sure to include plenty of vegetables, fruits, low-fat dairy products, and lean protein.  Do not eat a lot of foods high in solid fats, added sugars, or salt.  Get regular exercise. This is one of the most important things you can do for your health. ? Most adults should exercise for at least 150 minutes each week. The exercise should increase your heart rate and make you sweat (moderate-intensity exercise). ? Most adults should also do strengthening exercises at least twice a week. This is in addition to the moderate-intensity exercise.  Maintain a healthy weight  Body mass index (BMI) is a measurement that can be used to identify possible weight problems. It estimates body fat based on height and weight. Your health care provider can help determine your BMI and help you achieve or maintain a healthy weight.  For females 64 years of age and older: ? A BMI below 18.5 is considered  underweight. ? A BMI of 18.5 to 24.9 is normal. ? A BMI of 25 to 29.9 is considered overweight. ? A BMI of 30 and above is considered obese.  Watch levels of cholesterol and blood lipids  You should start having your blood tested for lipids and cholesterol at 44 years of age, then have this test every 5 years.  You may need to have your cholesterol levels checked more often if: ? Your lipid or cholesterol levels are high. ? You are older than 44 years of age. ? You are at high risk for heart disease.  Cancer screening Lung Cancer  Lung cancer screening is recommended for adults 36-16 years old who are at high risk for lung cancer because of a history of smoking.  A yearly low-dose CT scan of the lungs is recommended for people who: ? Currently smoke. ? Have quit within the past 15 years. ? Have at least a 30-pack-year history of smoking. A pack year is smoking an average of one pack of cigarettes a day for 1 year.  Yearly screening should continue until it has been 15 years since you quit.  Yearly screening should stop if you develop a health problem that would prevent you from having lung cancer treatment.  Breast Cancer  Practice breast self-awareness. This means understanding how your breasts normally appear and feel.  It also means doing regular breast self-exams. Let your health care provider know about any changes, no matter how small.  If you are in your 20s or 30s, you should have a clinical breast exam (CBE) by a health care provider every 1-3 years as part of a regular health exam.  If you are 40 or older, have a CBE every year. Also consider having a breast X-ray (mammogram) every year.  If you have a family history of breast cancer, talk to your health care provider about genetic screening.  If you are at high risk for breast cancer, talk to your health care provider about having an MRI and a mammogram every year.  Breast cancer gene (BRCA) assessment is  recommended for women who have family members with BRCA-related cancers. BRCA-related cancers include: ? Breast. ? Ovarian. ? Tubal. ? Peritoneal cancers.  Results of the assessment will determine the need for genetic counseling and BRCA1 and BRCA2 testing.  Cervical Cancer Your health care provider may recommend that you be screened regularly for cancer of the pelvic organs (ovaries, uterus, and vagina). This screening involves a pelvic examination, including checking for microscopic changes to the surface of your cervix (Pap test). You may be encouraged to have this screening done every 3 years, beginning at age 21.  For women ages 30-65, health care providers may recommend pelvic exams and Pap testing every 3 years, or they may recommend the Pap and pelvic exam, combined with testing for human papilloma virus (HPV), every 5 years. Some types of HPV increase your risk of cervical cancer. Testing for HPV may also be done on women of any age with unclear Pap test results.  Other health care providers may not recommend any screening for nonpregnant women who are considered low risk for pelvic cancer and who do not have symptoms. Ask your health care provider if a screening pelvic exam is right for you.  If you have had past treatment for cervical cancer or a condition that could lead to cancer, you need Pap tests and screening for cancer for at least 20 years after your treatment. If Pap tests have been discontinued, your risk factors (such as having a new sexual partner) need to be reassessed to determine if screening should resume. Some women have medical problems that increase the chance of getting cervical cancer. In these cases, your health care provider may recommend more frequent screening and Pap tests.  Colorectal Cancer  This type of cancer can be detected and often prevented.  Routine colorectal cancer screening usually begins at 44 years of age and continues through 44 years of  age.  Your health care provider may recommend screening at an earlier age if you have risk factors for colon cancer.  Your health care provider may also recommend using home test kits to check for hidden blood in the stool.  A small camera at the end of a tube can be used to examine your colon directly (sigmoidoscopy or colonoscopy). This is done to check for the earliest forms of colorectal cancer.  Routine screening usually begins at age 50.  Direct examination of the colon should be repeated every 5-10 years through 44 years of age. However, you may need to be screened more often if early forms of precancerous polyps or small growths are found.  Skin Cancer  Check your skin from head to toe regularly.  Tell your health care provider about any new moles or changes in moles, especially if there is a change in a mole's shape or color.  Also tell your health care provider if you have a mole that is   larger than the size of a pencil eraser.  Always use sunscreen. Apply sunscreen liberally and repeatedly throughout the day.  Protect yourself by wearing long sleeves, pants, a wide-brimmed hat, and sunglasses whenever you are outside.  Heart disease, diabetes, and high blood pressure  High blood pressure causes heart disease and increases the risk of stroke. High blood pressure is more likely to develop in: ? People who have blood pressure in the high end of the normal range (130-139/85-89 mm Hg). ? People who are overweight or obese. ? People who are African American.  If you are 18-39 years of age, have your blood pressure checked every 3-5 years. If you are 40 years of age or older, have your blood pressure checked every year. You should have your blood pressure measured twice-once when you are at a hospital or clinic, and once when you are not at a hospital or clinic. Record the average of the two measurements. To check your blood pressure when you are not at a hospital or clinic, you  can use: ? An automated blood pressure machine at a pharmacy. ? A home blood pressure monitor.  If you are between 55 years and 79 years old, ask your health care provider if you should take aspirin to prevent strokes.  Have regular diabetes screenings. This involves taking a blood sample to check your fasting blood sugar level. ? If you are at a normal weight and have a low risk for diabetes, have this test once every three years after 45 years of age. ? If you are overweight and have a high risk for diabetes, consider being tested at a younger age or more often. Preventing infection Hepatitis B  If you have a higher risk for hepatitis B, you should be screened for this virus. You are considered at high risk for hepatitis B if: ? You were born in a country where hepatitis B is common. Ask your health care provider which countries are considered high risk. ? Your parents were born in a high-risk country, and you have not been immunized against hepatitis B (hepatitis B vaccine). ? You have HIV or AIDS. ? You use needles to inject street drugs. ? You live with someone who has hepatitis B. ? You have had sex with someone who has hepatitis B. ? You get hemodialysis treatment. ? You take certain medicines for conditions, including cancer, organ transplantation, and autoimmune conditions.  Hepatitis C  Blood testing is recommended for: ? Everyone born from 1945 through 1965. ? Anyone with known risk factors for hepatitis C.  Sexually transmitted infections (STIs)  You should be screened for sexually transmitted infections (STIs) including gonorrhea and chlamydia if: ? You are sexually active and are younger than 44 years of age. ? You are older than 44 years of age and your health care provider tells you that you are at risk for this type of infection. ? Your sexual activity has changed since you were last screened and you are at an increased risk for chlamydia or gonorrhea. Ask your  health care provider if you are at risk.  If you do not have HIV, but are at risk, it may be recommended that you take a prescription medicine daily to prevent HIV infection. This is called pre-exposure prophylaxis (PrEP). You are considered at risk if: ? You are sexually active and do not regularly use condoms or know the HIV status of your partner(s). ? You take drugs by injection. ? You are sexually active   with a partner who has HIV.  Talk with your health care provider about whether you are at high risk of being infected with HIV. If you choose to begin PrEP, you should first be tested for HIV. You should then be tested every 3 months for as long as you are taking PrEP. Pregnancy  If you are premenopausal and you may become pregnant, ask your health care provider about preconception counseling.  If you may become pregnant, take 400 to 800 micrograms (mcg) of folic acid every day.  If you want to prevent pregnancy, talk to your health care provider about birth control (contraception). Osteoporosis and menopause  Osteoporosis is a disease in which the bones lose minerals and strength with aging. This can result in serious bone fractures. Your risk for osteoporosis can be identified using a bone density scan.  If you are 20 years of age or older, or if you are at risk for osteoporosis and fractures, ask your health care provider if you should be screened.  Ask your health care provider whether you should take a calcium or vitamin D supplement to lower your risk for osteoporosis.  Menopause may have certain physical symptoms and risks.  Hormone replacement therapy may reduce some of these symptoms and risks. Talk to your health care provider about whether hormone replacement therapy is right for you. Follow these instructions at home:  Schedule regular health, dental, and eye exams.  Stay current with your immunizations.  Do not use any tobacco products including cigarettes, chewing  tobacco, or electronic cigarettes.  If you are pregnant, do not drink alcohol.  If you are breastfeeding, limit how much and how often you drink alcohol.  Limit alcohol intake to no more than 1 drink per day for nonpregnant women. One drink equals 12 ounces of beer, 5 ounces of wine, or 1 ounces of hard liquor.  Do not use street drugs.  Do not share needles.  Ask your health care provider for help if you need support or information about quitting drugs.  Tell your health care provider if you often feel depressed.  Tell your health care provider if you have ever been abused or do not feel safe at home. This information is not intended to replace advice given to you by your health care provider. Make sure you discuss any questions you have with your health care provider. Document Released: 03/28/2011 Document Revised: 02/18/2016 Document Reviewed: 06/16/2015 Elsevier Interactive Patient Education  [employer].

## 2018-06-14 NOTE — Progress Notes (Signed)
Brooke Schroeder is a 42 y.o. female presents to office today for annual physical exam examination.    Concerns today include: 1.  Needs forms completed for work.  Patient sees Dr. Chalmers Cater for hypothyroidism.  She denies any history of ablation or surgical intervention for her thyroid.  She notes hypothyroidism was found incidentally on routine labs.  She reports compliance with thyroid medication.  Denies any palpitations, difficulty swallowing, change in voice, diarrhea or constipation.  She is followed by [facility] for Pap smears.  She had a Pap smear recently which was normal.  She also got her mammogram performed and actually had to go for further testing at the breast center.  She had a spot in her breast that was determined to be a cyst.  She has follow-up in 6 months for recheck of this.  Denies any breast changes, nipple discharge, skin abnormalities.  She does have occasional tenderness with menstrual cycles, which are regular.  Occupation: works on a Teaching laboratory technician, Substance use: none Diet: good Last mammogram: UTD, see above Last pap smear: UTD.  ROI completed today Refills needed today: none Immunizations needed: Flu shot.  Past Medical History:  Diagnosis Date  . Thyroid disease   . UTI (lower urinary tract infection)    Social History   Socioeconomic History  . Marital status: Married    Spouse name: Not on file  . Number of children: Not on file  . Years of education: Not on file  . Highest education level: Not on file  Occupational History  . Not on file  Social Needs  . Financial resource strain: Not on file  . Food insecurity:    Worry: Not on file    Inability: Not on file  . Transportation needs:    Medical: Not on file    Non-medical: Not on file  Tobacco Use  . Smoking status: Never Smoker  . Smokeless tobacco: Never Used  Substance and Sexual Activity  . Alcohol use: No  . Drug use: No  . Sexual activity: Not on file  Lifestyle  . Physical  activity:    Days per week: Not on file    Minutes per session: Not on file  . Stress: Not on file  Relationships  . Social connections:    Talks on phone: Not on file    Gets together: Not on file    Attends religious service: Not on file    Active member of club or organization: Not on file    Attends meetings of clubs or organizations: Not on file    Relationship status: Not on file  . Intimate partner violence:    Fear of current or ex partner: Not on file    Emotionally abused: Not on file    Physically abused: Not on file    Forced sexual activity: Not on file  Other Topics Concern  . Not on file  Social History Narrative  . Not on file   No past surgical history on file. No family history on file.  Current Outpatient Medications:  .  levothyroxine (SYNTHROID, LEVOTHROID) 112 MCG tablet, Take 1 tablet (112 mcg total) by mouth daily., Disp: 30 tablet, Rfl: 0  No Known Allergies   ROS: Review of Systems Constitutional: negative Eyes: negative Ears, nose, mouth, throat, and face: negative Respiratory: negative Cardiovascular: negative Gastrointestinal: negative Genitourinary:negative Integument/breast: positive for breast cyst and breast tenderness w/ menses Hematologic/lymphatic: negative Musculoskeletal:positive for occasional hand and foot aching. Neurological: negative Behavioral/Psych:  negative Endocrine: negative Allergic/Immunologic: negative    Physical exam BP 140/88   Pulse 78   Temp 98.7 F (37.1 C) (Oral)   Ht _0  (1.626 m)   Wt 164 lb (74.4 kg)   BMI 28.15 kg/m  General appearance: alert, cooperative, appears stated age and no distress Head: Normocephalic, without obvious abnormality, atraumatic Eyes: negative findings: lids and lashes normal, conjunctivae and sclerae normal, corneas clear and pupils equal, round, reactive to light and accomodation Ears: normal TM's and external ear canals both ears Nose: Nares normal. Septum midline.  Mucosa normal. No drainage or sinus tenderness. Throat: lips, mucosa, and tongue normal; teeth and gums normal Neck: no adenopathy, supple, symmetrical, trachea midline and thyroid not enlarged, symmetric, no tenderness/mass/nodules Back: symmetric, no curvature. ROM normal. No CVA tenderness. Lungs: clear to auscultation bilaterally Heart: regular rate and rhythm, S1, S2 normal, no murmur, click, rub or gallop Abdomen: soft, non-tender; bowel sounds normal; no masses,  no organomegaly Extremities: extremities normal, atraumatic, no cyanosis or edema Pulses: 2+ and symmetric Skin: Skin color, texture, turgor normal. No rashes or lesions Lymph nodes: Cervical, supraclavicular, and axillary nodes normal. Neurologic: Alert and oriented X 3, normal strength and tone. Normal symmetric reflexes. Normal coordination and gait  Psych: Mood stable, speech normal, affect appropriate, pleasant, interactive Depression screen [facility] 2/9 06/14/2018 07/05/2016  Decreased Interest 0 0  Down, Depressed, Hopeless 0 0  PHQ - 2 Score 0 0    Assessment/ Plan: [name] here for annual physical exam.   1. Wellness examination BMI slightly elevated but overall doing very well.  Will need to watch her blood pressure closely as her systolic blood pressure is borderline.  I suspect anxiety surrounding appointment is contributing.  Will obtain records from endocrinologist and from OB/GYN.  She had her mammogram and Pap smear performed with OB/GYN recently.  2. Hypothyroidism, unspecified type Currently doing well on current dose of Synthroid.  Continue to follow with endocrinology as scheduled.  3. Screening, lipid Patient is fasting. - Lipid Panel  4. Screening for metabolic disorder - BBC48+GQBV  5. Screening, anemia, deficiency, iron - CBC with Differential  Arthralgia Intermittent and predominate affecting wrists and feet.  We discussed use of oral NSAID or Tylenol.  If symptoms are refractory to  this, may need to brace her wrists.  She is increased risk of her carpal tunnel given use of computer daily.  If need specialist going forward, she will contact me and we will send to orthopedics.   Handout provided on healthy lifestyle choices, including diet (rich in fruits, vegetables and lean meats and low in salt and simple carbohydrates) and exercise (at least 30 minutes of moderate physical activity daily).  Patient to follow up in 1 year for annual exam or sooner if needed.  Taydem Cavagnaro M. Lajuana Ripple, DO

## 2018-10-17 ENCOUNTER — Ambulatory Visit
Admission: RE | Admit: 2018-10-17 | Discharge: 2018-10-17 | Disposition: A | Discharge status: Home / Self Care | Payer: Commercial Managed Care - PPO | Source: Ambulatory Visit | Attending: Obstetrics and Gynecology | Admitting: Obstetrics and Gynecology

## 2018-10-17 DIAGNOSIS — N63 Unspecified lump in unspecified breast: Secondary | ICD-10-CM

## 2018-10-17 DIAGNOSIS — R928 Other abnormal and inconclusive findings on diagnostic imaging of breast: Secondary | ICD-10-CM | POA: Diagnosis not present

## 2019-03-07 ENCOUNTER — Encounter (INDEPENDENT_AMBULATORY_CARE_PROVIDER_SITE_OTHER): Payer: Self-pay

## 2019-04-10 IMAGING — MG DIGITAL DIAGNOSTIC UNILATERAL LEFT MAMMOGRAM WITH TOMO AND CAD
8 series · 8 of 24 positions shown · non-contrast
Comparison: Baseline screening mammogram dated 04/02/2018.

CLINICAL DATA: Patient returns today to evaluate a LEFT breast
asymmetry identified on recent baseline screening mammogram.

EXAM:
DIGITAL DIAGNOSTIC LEFT MAMMOGRAM WITH CAD AND TOMO
ULTRASOUND LEFT BREAST

[L MLO synth-2D (1 of 2)]
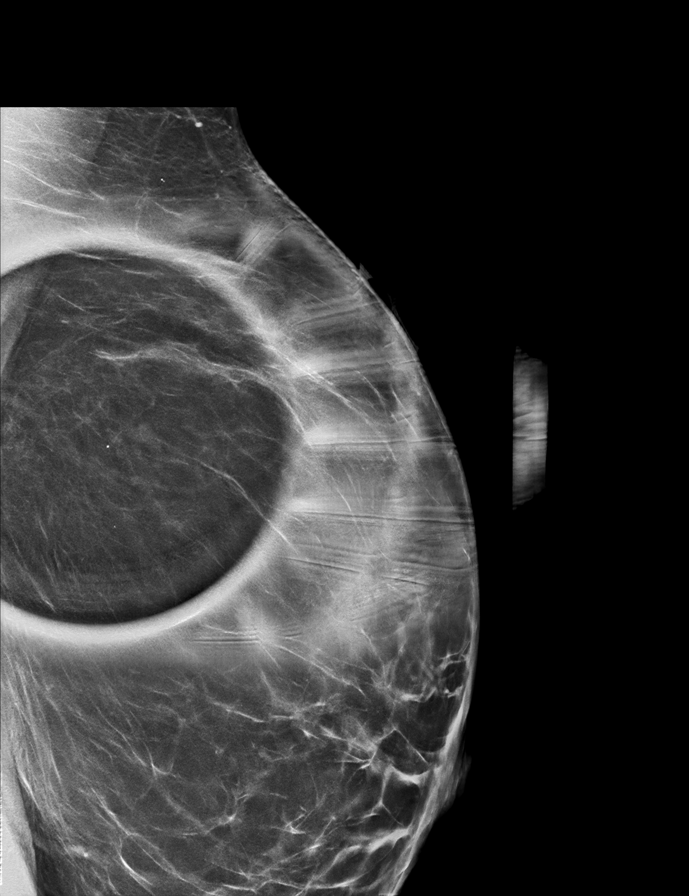

[L MLO synth-2D (2 of 2)]
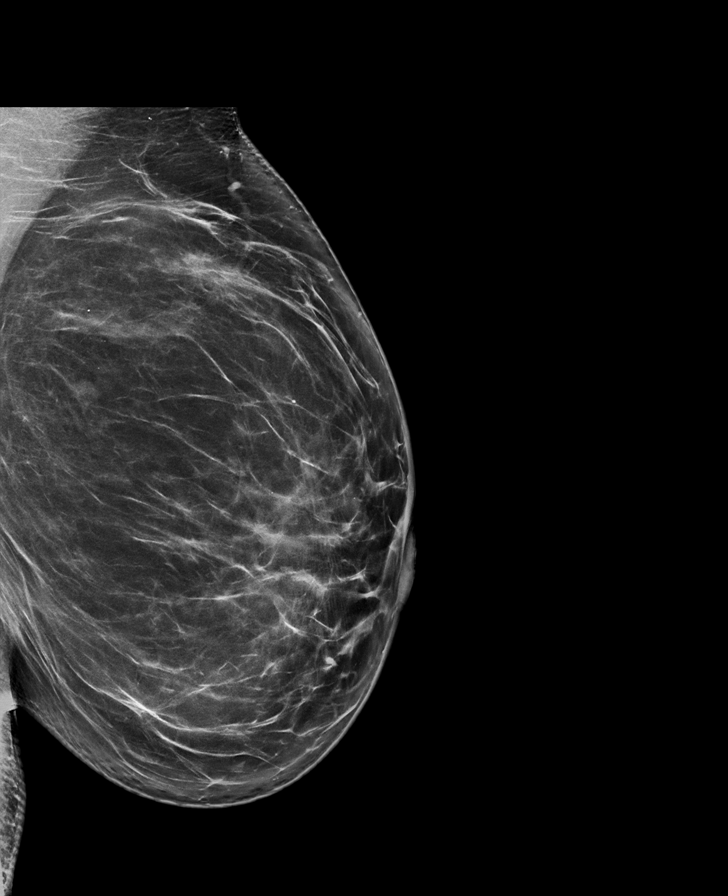

[L XCCL synth-2D]
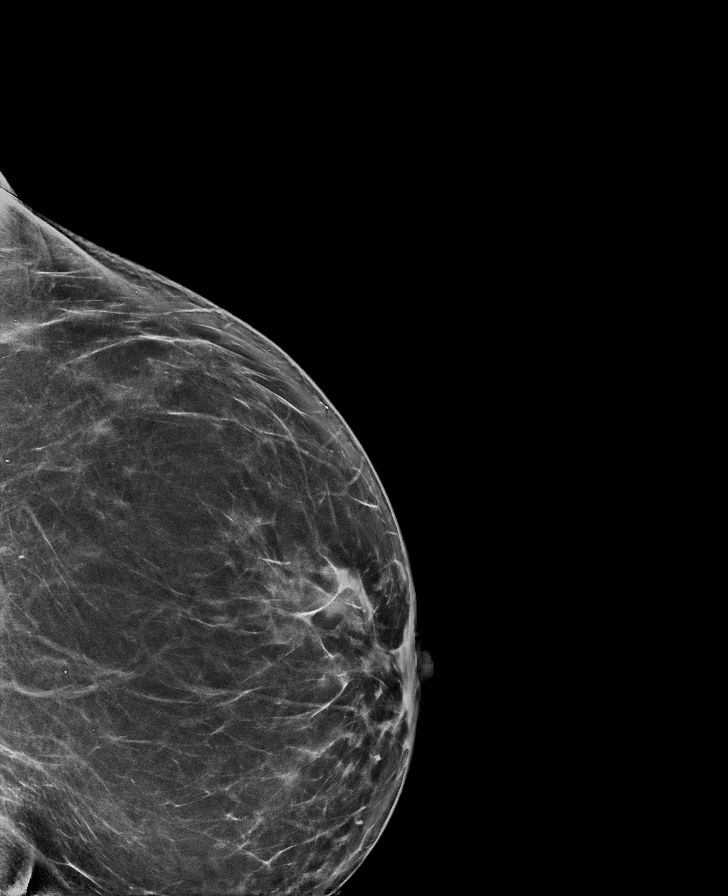

[L CC synth-2D]
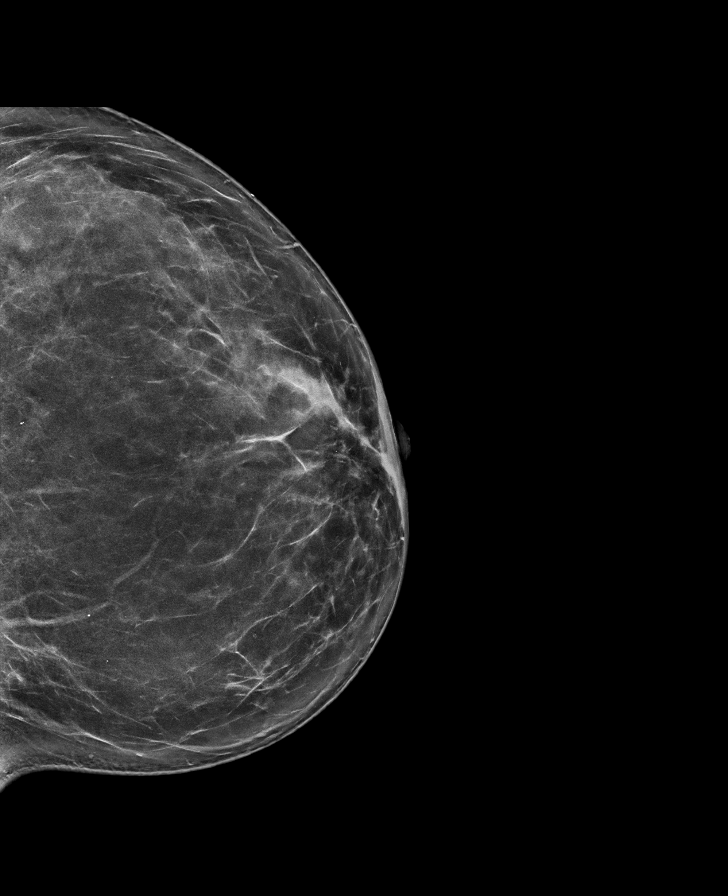

[L MLO tomo (1 of 2) · tomo slice 48/95.0]
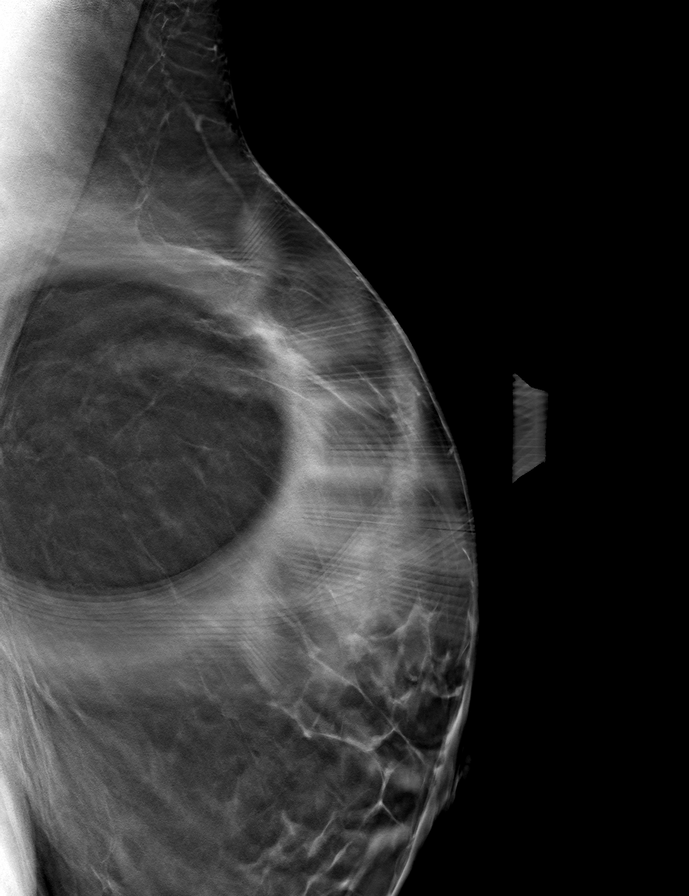

[L CC tomo · tomo slice 43/86.0]
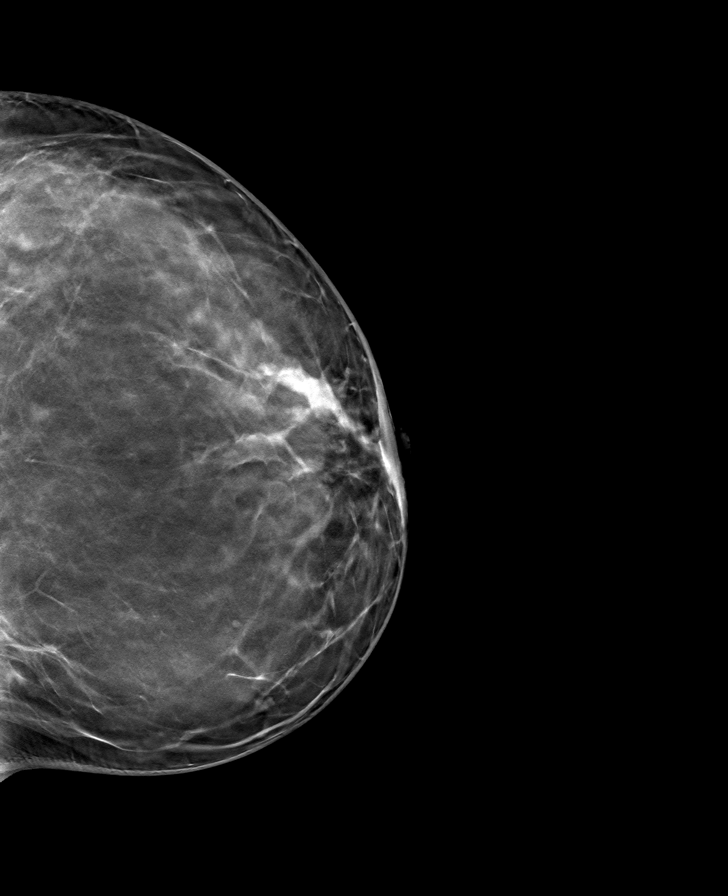

[L XCCL tomo · tomo slice 45/90.0]
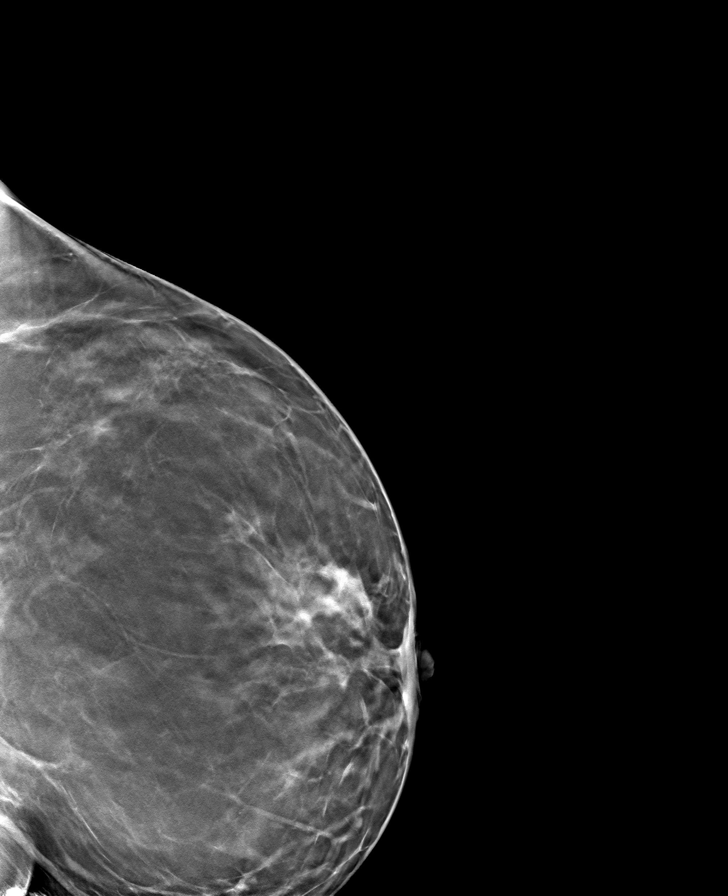

[L MLO tomo (2 of 2) · tomo slice 46/91.0]
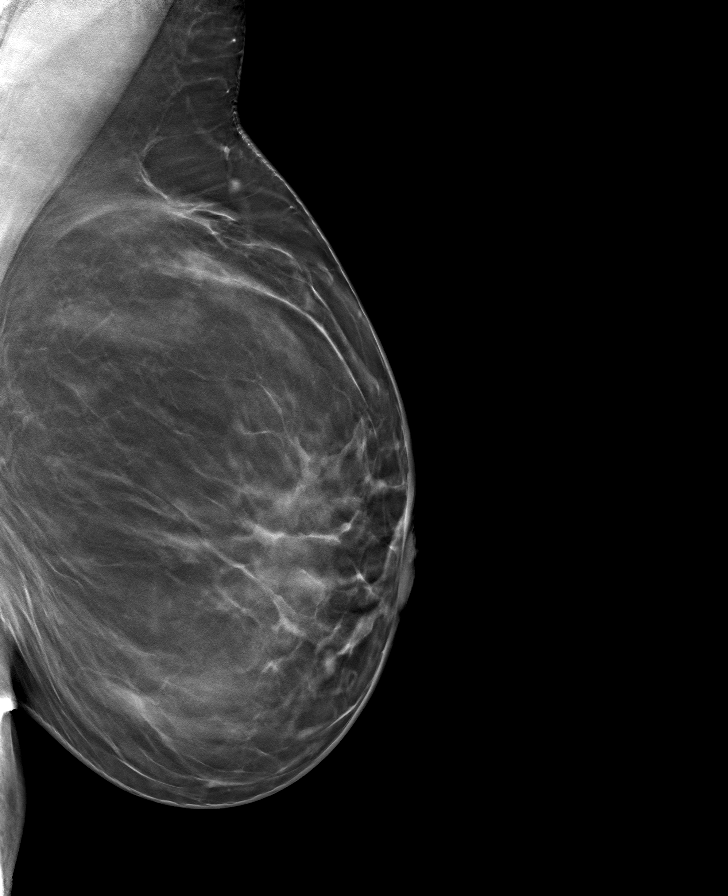

[8 of 24 positions shown; findings below may reference images not displayed]

ACR Breast Density Category b: There are scattered areas of
fibroglandular density.
FINDINGS: There is a partially obscured mass confirmed within the upper-outer
quadrant of the LEFT breast, at posterior depth, measuring
approximately 7 mm.

Mammographic images were processed with CAD.

Targeted ultrasound is performed, showing a benign cyst in the LEFT
breast at the 2:30 o'clock axis, 12 cm from the nipple, measuring 6
mm, a likely correlate for the mammographic finding.
IMPRESSION: Probably benign mass within the upper-outer quadrant of the LEFT
breast, at posterior depth, measuring 7 mm on today's additional
mammographic views, likely corresponding to a benign cyst identified
by ultrasound in the LEFT breast at the 2:30 o'clock axis. Recommend
follow-up LEFT breast diagnostic mammogram in 6 months to ensure
stability of the mammographic finding.

RECOMMENDATION:
LEFT breast diagnostic mammogram in 6 months.

I have discussed the findings and recommendations with the patient.
Results were also provided in writing at the conclusion of the
visit. If applicable, a reminder letter will be sent to the patient
regarding the next appointment.

BI-RADS CATEGORY  3: Probably benign.

## 2019-04-10 IMAGING — US ULTRASOUND LEFT BREAST LIMITED
1 series · 6 of 6 positions shown · non-contrast
Comparison: Baseline screening mammogram dated 04/02/2018.

CLINICAL DATA: Patient returns today to evaluate a LEFT breast
asymmetry identified on recent baseline screening mammogram.

EXAM:
DIGITAL DIAGNOSTIC LEFT MAMMOGRAM WITH CAD AND TOMO
ULTRASOUND LEFT BREAST

[Series 1: ultrasound left breast limited · 0.07mm/px · 6 of 6 slices shown]
[im 1/6]
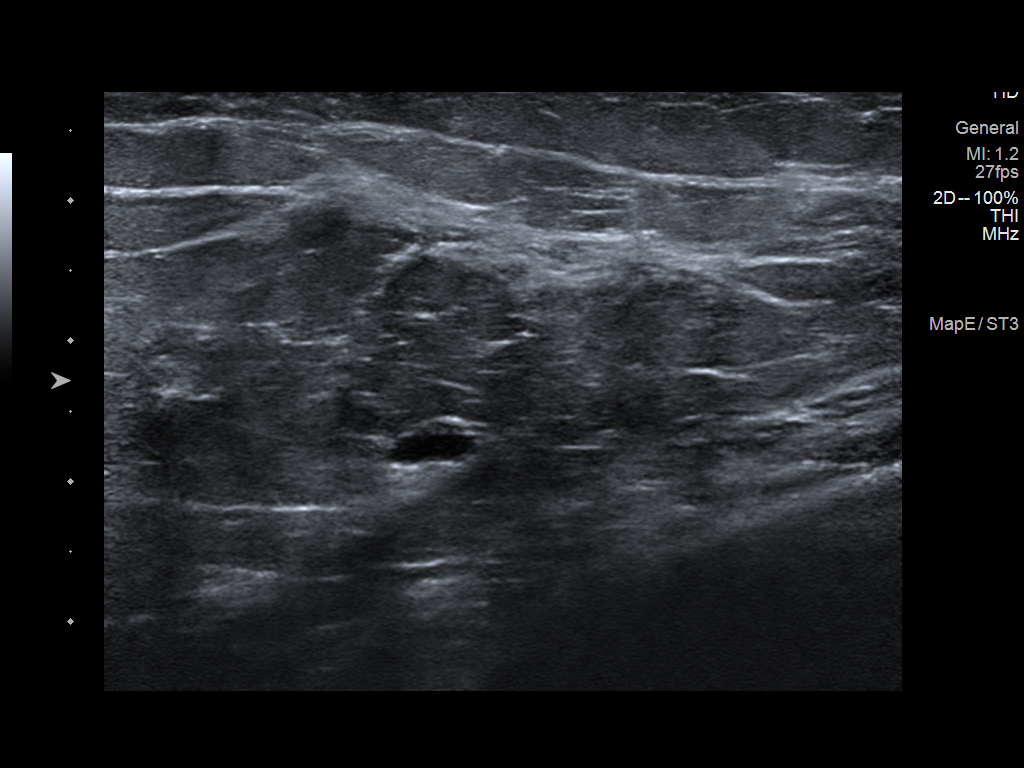
[im 2/6]
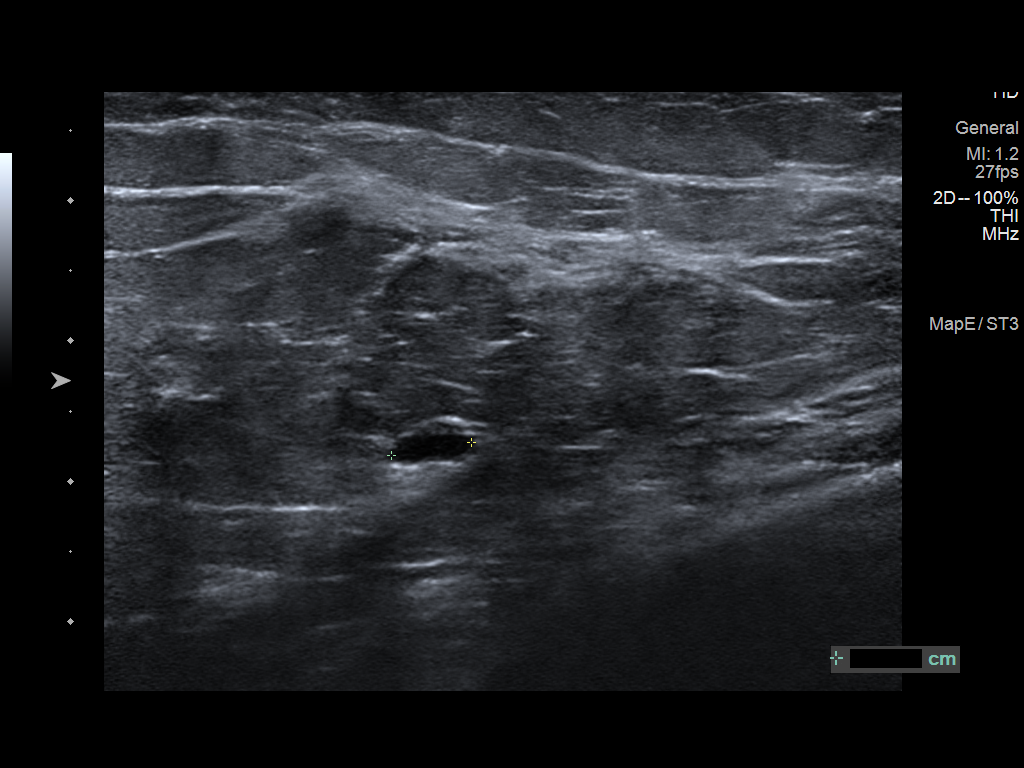
[im 3/6]
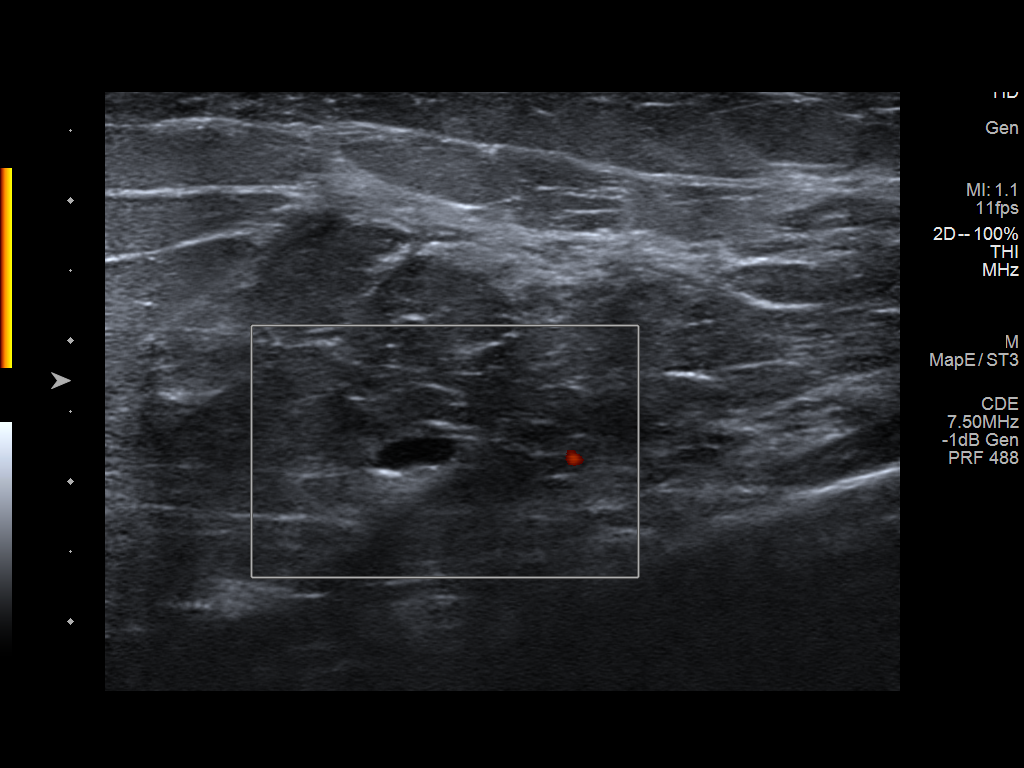
[im 4/6]
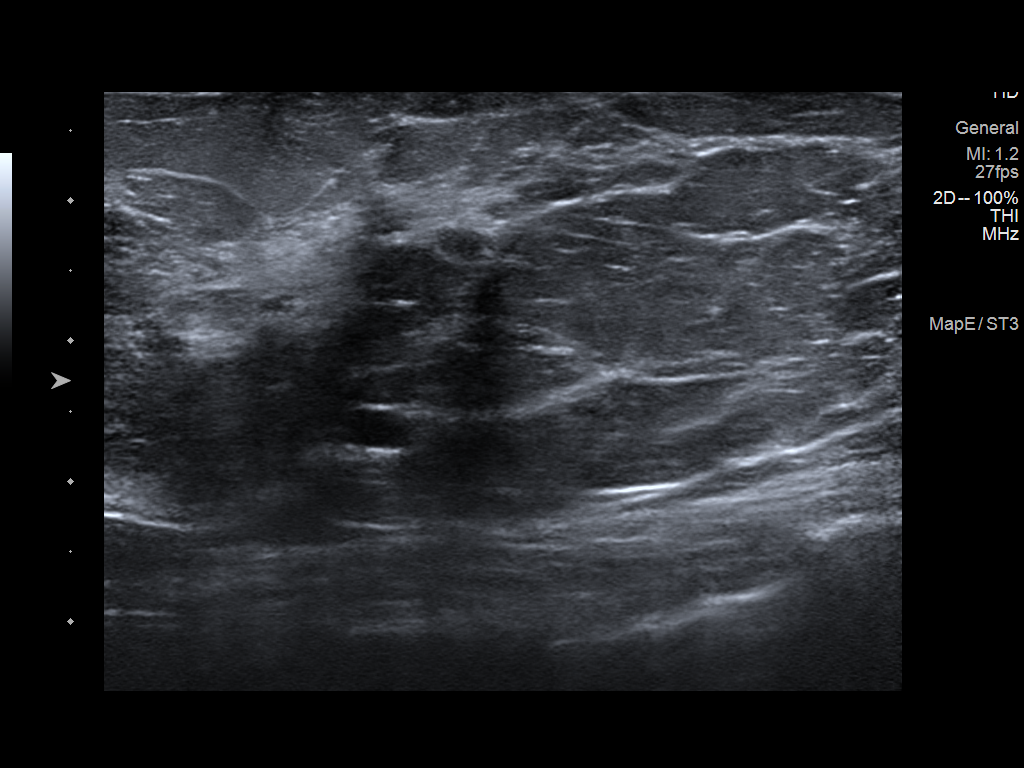
[im 5/6]
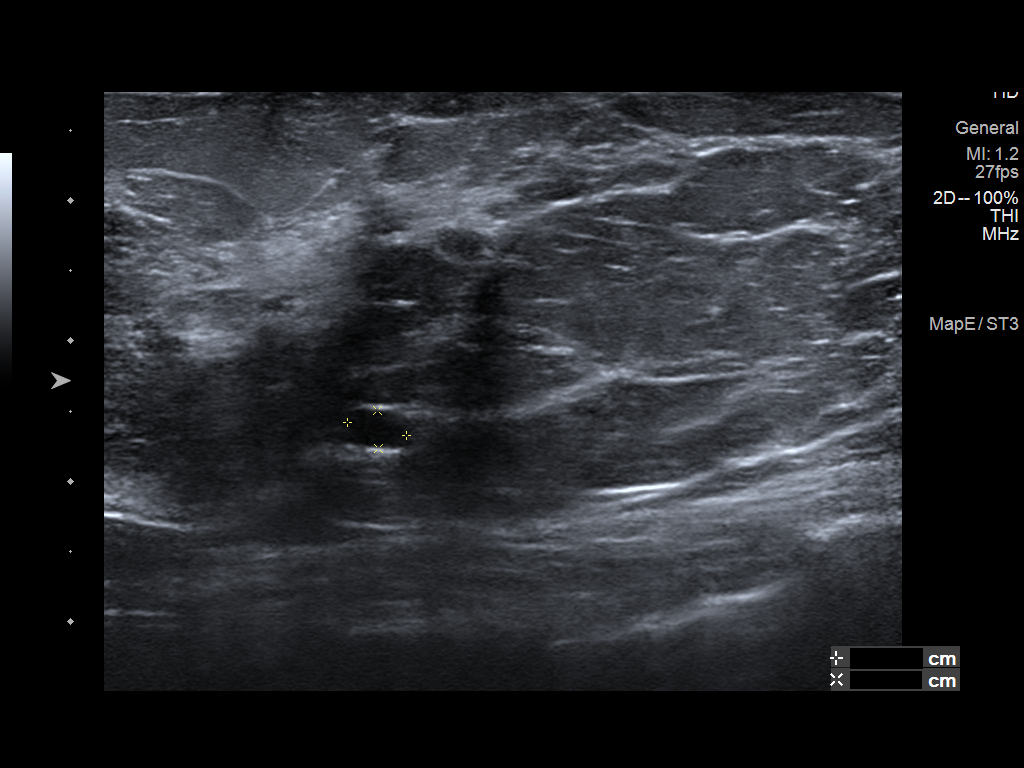
[im 6/6]
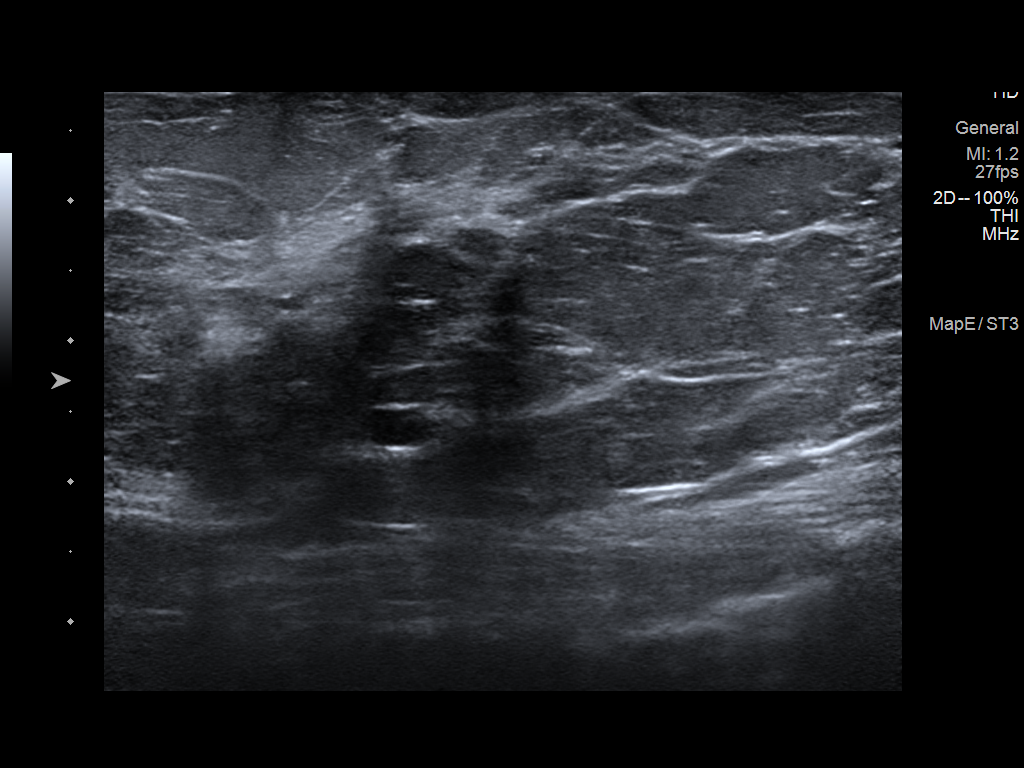

[6 of 6 positions shown; findings below may reference images not displayed]

ACR Breast Density Category b: There are scattered areas of
fibroglandular density.
FINDINGS: There is a partially obscured mass confirmed within the upper-outer
quadrant of the LEFT breast, at posterior depth, measuring
approximately 7 mm.

Mammographic images were processed with CAD.

Targeted ultrasound is performed, showing a benign cyst in the LEFT
breast at the 2:30 o'clock axis, 12 cm from the nipple, measuring 6
mm, a likely correlate for the mammographic finding.
IMPRESSION: Probably benign mass within the upper-outer quadrant of the LEFT
breast, at posterior depth, measuring 7 mm on today's additional
mammographic views, likely corresponding to a benign cyst identified
by ultrasound in the LEFT breast at the 2:30 o'clock axis. Recommend
follow-up LEFT breast diagnostic mammogram in 6 months to ensure
stability of the mammographic finding.

RECOMMENDATION:
LEFT breast diagnostic mammogram in 6 months.

I have discussed the findings and recommendations with the patient.
Results were also provided in writing at the conclusion of the
visit. If applicable, a reminder letter will be sent to the patient
regarding the next appointment.

BI-RADS CATEGORY  3: Probably benign.

## 2019-04-18 ENCOUNTER — Other Ambulatory Visit: Payer: Self-pay | Admitting: Obstetrics and Gynecology

## 2019-04-18 DIAGNOSIS — N644 Mastodynia: Secondary | ICD-10-CM

## 2019-04-18 DIAGNOSIS — N6320 Unspecified lump in the left breast, unspecified quadrant: Secondary | ICD-10-CM

## 2019-04-23 ENCOUNTER — Ambulatory Visit
Admission: RE | Admit: 2019-04-23 | Discharge: 2019-04-23 | Disposition: A | Discharge status: Home / Self Care | Payer: Commercial Managed Care - PPO | Source: Ambulatory Visit | Attending: Obstetrics and Gynecology | Admitting: Obstetrics and Gynecology

## 2019-04-23 ENCOUNTER — Other Ambulatory Visit: Payer: Self-pay

## 2019-04-23 DIAGNOSIS — N6320 Unspecified lump in the left breast, unspecified quadrant: Secondary | ICD-10-CM

## 2019-10-04 ENCOUNTER — Telehealth: Payer: Self-pay | Admitting: Family Medicine
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
## 2020-05-20 ENCOUNTER — Other Ambulatory Visit: Payer: Self-pay | Admitting: Obstetrics and Gynecology

## 2020-05-20 DIAGNOSIS — N6320 Unspecified lump in the left breast, unspecified quadrant: Secondary | ICD-10-CM

## 2020-06-17 ENCOUNTER — Other Ambulatory Visit: Payer: Commercial Managed Care - PPO

## 2020-06-23 ENCOUNTER — Other Ambulatory Visit: Payer: Self-pay

## 2020-06-23 ENCOUNTER — Ambulatory Visit: Payer: Commercial Managed Care - PPO

## 2020-06-23 ENCOUNTER — Ambulatory Visit
Admission: RE | Admit: 2020-06-23 | Discharge: 2020-06-23 | Disposition: A | Discharge status: Home / Self Care | Payer: Commercial Managed Care - PPO | Source: Ambulatory Visit | Attending: Obstetrics and Gynecology | Admitting: Obstetrics and Gynecology

## 2020-06-23 DIAGNOSIS — N6320 Unspecified lump in the left breast, unspecified quadrant: Secondary | ICD-10-CM

## 2020-08-05 ENCOUNTER — Encounter (HOSPITAL_COMMUNITY): Payer: Self-pay | Admitting: Emergency Medicine

## 2020-08-05 ENCOUNTER — Other Ambulatory Visit: Payer: Self-pay

## 2020-08-05 ENCOUNTER — Emergency Department (HOSPITAL_COMMUNITY)
Admission: EM | Admit: 2020-08-05 | Discharge: 2020-08-05 | Disposition: A | Discharge status: Home / Self Care | Payer: Commercial Managed Care - PPO | Attending: Emergency Medicine | Admitting: Emergency Medicine

## 2020-08-05 ENCOUNTER — Emergency Department (HOSPITAL_COMMUNITY): Payer: Commercial Managed Care - PPO

## 2020-08-05 DIAGNOSIS — Z79899 Other long term (current) drug therapy: Secondary | ICD-10-CM | POA: Insufficient documentation

## 2020-08-05 DIAGNOSIS — R079 Chest pain, unspecified: Secondary | ICD-10-CM

## 2020-08-05 DIAGNOSIS — E039 Hypothyroidism, unspecified: Secondary | ICD-10-CM | POA: Insufficient documentation

## 2020-08-05 DIAGNOSIS — R0789 Other chest pain: Secondary | ICD-10-CM | POA: Diagnosis present

## 2020-08-05 LAB — CBC
HCT: 42.3 % (ref 36.0–46.0)
Hemoglobin: 13.4 g/dL (ref 12.0–15.0)
MCH: 26.4 pg (ref 26.0–34.0)
MCHC: 31.7 g/dL (ref 30.0–36.0)
MCV: 83.4 fL (ref 80.0–100.0)
Platelets: 193 10*3/uL (ref 150–400)
RBC: 5.07 MIL/uL (ref 3.87–5.11)
RDW: 13.2 % (ref 11.5–15.5)
WBC: 10.2 10*3/uL (ref 4.0–10.5)
nRBC: 0 % (ref 0.0–0.2)

## 2020-08-05 LAB — BASIC METABOLIC PANEL
Anion gap: 11 (ref 5–15)
BUN: 15 mg/dL (ref 6–20)
CO2: 22 mmol/L (ref 22–32)
Calcium: 9.1 mg/dL (ref 8.9–10.3)
Chloride: 104 mmol/L (ref 98–111)
Creatinine, Ser: 0.91 mg/dL (ref 0.44–1.00)
GFR, Estimated: >60 mL/min (ref >60)
Glucose, Bld: 99 mg/dL (ref 70–99)
Potassium: 4 mmol/L (ref 3.5–5.1)
Sodium: 137 mmol/L (ref 135–145)

## 2020-08-05 LAB — TROPONIN I (HIGH SENSITIVITY): Troponin I (High Sensitivity): <2 ng/L (ref <18)

## 2020-08-05 LAB — PREGNANCY, URINE: Preg Test, Ur: NEGATIVE

## 2020-08-05 LAB — D-DIMER, QUANTITATIVE: D-Dimer, Quant: <0.27 ug/mL-FEU (ref 0.00–0.50)

## 2020-08-05 MED ORDER — KETOROLAC TROMETHAMINE 30 MG/ML IJ SOLN
15.0000 mg | Freq: Once | INTRAMUSCULAR | Status: AC
  Administered 2020-08-05: 15 mg via INTRAVENOUS
  Filled 2020-08-05: qty 1

## 2020-08-05 MED ORDER — ASPIRIN 81 MG PO CHEW
324.0000 mg | CHEWABLE_TABLET | Freq: Once | ORAL | Status: AC
  Administered 2020-08-05: 324 mg via ORAL
  Filled 2020-08-05: qty 4

## 2020-08-05 MED ORDER — SODIUM CHLORIDE 0.9 % IV BOLUS
1000.0000 mL | Freq: Once | INTRAVENOUS | Status: AC
  Administered 2020-08-05: 1000 mL via INTRAVENOUS

## 2020-08-05 NOTE — ED Triage Notes (Signed)
Seen at Urgent Care at 4 pm today.  EKG done and told to come to ED.  Pain is Mid chest radiating into neck, pain 4-5/10.  Feel pressure in throat but no pain.

## 2020-08-05 NOTE — Discharge Instructions (Signed)
If you develop recurrent, continued, or worsening chest pain, shortness of breath, fever, vomiting, abdominal or back pain, or any other new/concerning symptoms then return to the ER for evaluation.  

## 2020-08-05 NOTE — ED Provider Notes (Signed)
[facility] EMERGENCY DEPARTMENT Provider Note   CSN: [id number] Arrival date & time: 08/05/20  1723     History Chief Complaint  Patient presents with  . Chest Pain    Brooke Schroeder is a 44 y.o. female.  HPI 44 year old female presents with chest pain.  Started around 230 or 3 PM while at work.  Feels like a tightness/pain in her chest but also goes into her neck and feels like a pressure in her neck. No dyspnea, but some pleuritic component. Worse with certain movements, and also walking. No fever, vomiting, diaphoresis. No leg swelling or recent travel. Pain is about 4/10. Has not taken anything for the pain. Has a history of HTN when seen by doctors, not on meds. No smoking history, or history of diabetes, hypercholesterolemia, or family history of coronary disease. No recent URI.  Past Medical History:  Diagnosis Date  . Thyroid disease   . UTI (lower urinary tract infection)     Patient Active Problem List   Diagnosis Date Noted  . Hypothyroidism 06/14/2018  . Breast cyst 06/14/2018  . Arthralgia 06/14/2018  . Wellness examination 07/05/2016    History reviewed. No pertinent surgical history.   OB History   No obstetric history on file.     History reviewed. No pertinent family history.  Social History   Tobacco Use  . Smoking status: Never Smoker  . Smokeless tobacco: Never Used  Substance Use Topics  . Alcohol use: No  . Drug use: No    Home Medications Prior to Admission medications   Medication Sig Start Date End Date Taking? Authorizing Provider  levothyroxine (SYNTHROID) 88 MCG tablet Take 88 mcg by mouth every morning. 06/13/20  Yes [provider]  liothyronine (CYTOMEL) 5 MCG tablet Take 5 mcg by mouth daily. 06/18/20  Yes [provider]  Multiple Vitamin (MULTIVITAMIN WITH MINERALS) TABS tablet Take 1 tablet by mouth daily.   Yes [provider]    Allergies    Tetanus toxoids  Review of Systems   Review of  Systems  Constitutional: Negative for fever.  Respiratory: Positive for shortness of breath. Negative for cough.   Cardiovascular: Positive for chest pain. Negative for leg swelling.  Gastrointestinal: Negative for abdominal pain.    Physical Exam Updated Vital Signs BP (!) 145/93   Pulse 94   Temp 98.2 F (36.8 C)   Resp 16   Ht 5\' 3"  (1.6 m)   Wt 78 kg   LMP 07/13/2020 (Exact Date)   SpO2 99%   BMI 30.47 kg/m   Physical Exam Vitals and nursing note reviewed.  Constitutional:      Appearance: She is well-developed.  HENT:     Head: Normocephalic and atraumatic.     Right Ear: External ear normal.     Left Ear: External ear normal.     Nose: Nose normal.  Eyes:     General:        Right eye: No discharge.        Left eye: No discharge.  Cardiovascular:     Rate and Rhythm: Regular rhythm. Tachycardia present.     Heart sounds: Normal heart sounds.     Comments: HR~100 Pulmonary:     Effort: Pulmonary effort is normal.     Breath sounds: Normal breath sounds.  Chest:     Chest wall: Tenderness (mild, anterior) present.  Abdominal:     Palpations: Abdomen is soft.     Tenderness: There is  no abdominal tenderness.  Musculoskeletal:     Right lower leg: No tenderness. No edema.     Left lower leg: No tenderness. No edema.  Skin:    General: Skin is warm and dry.  Neurological:     Mental Status: She is alert.  Psychiatric:        Mood and Affect: Mood is not anxious.     ED Results / Procedures / Treatments   Labs (all labs ordered are listed, but only abnormal results are displayed) Labs Reviewed  BASIC METABOLIC PANEL  CBC  D-DIMER, QUANTITATIVE (NOT AT [facility])  PREGNANCY, URINE  TROPONIN I (HIGH SENSITIVITY)    EKG EKG Interpretation  Date/Time:  Wednesday August 05 2020 17:48:01 EST Ventricular Rate:  105 PR Interval:  128 QRS Duration: 76 QT Interval:  346 QTC Calculation: 457 R Axis:   50 Text Interpretation: Sinus tachycardia  nonspecific ST changes No old tracing to compare Confirmed by Pricilla Loveless [id number]) on 08/05/2020 5:57:03 PM   Radiology DG Chest 2 View  Result Date: 08/05/2020 CLINICAL DATA:  Chest pain EXAM: CHEST - 2 VIEW COMPARISON:  None. FINDINGS: The heart size and mediastinal contours are within normal limits. Both lungs are clear. The visualized skeletal structures are unremarkable. IMPRESSION: No active cardiopulmonary disease. Electronically Signed   By: Helyn Numbers MD   On: 08/05/2020 19:08    Procedures Procedures (including critical care time)  Medications Ordered in ED Medications  aspirin chewable tablet 324 mg (324 mg Oral Given 08/05/20 2020)  ketorolac (TORADOL) 30 MG/ML injection 15 mg (15 mg Intravenous Given 08/05/20 2020)  sodium chloride 0.9 % bolus 1,000 mL (0 mLs Intravenous Stopped 08/05/20 2152)    ED Course  I have reviewed the triage vital signs and the nursing notes.  Pertinent labs & imaging results that were available during my care of the patient were reviewed by me and considered in my medical decision making (see chart for details).    MDM Rules/Calculators/A&P                          Patient with chest wall pain.  She is feeling better with aspirin and Toradol.  This seems to be reproducible.  While her EKG is nonspecific, her troponin is negative after 5+ hours of symptoms and thus I think ACS is unlikely.  Chest x-ray has been personally reviewed and is benign.  Consider PE based on the pleuritic pain but with negative D-dimer I think this is unlikely.  At this point, I recommend ibuprofen and follow-up with PCP.  Discharged home with return precautions. Final Clinical Impression(s) / ED Diagnoses Final diagnoses:  Nonspecific chest pain    Rx / DC Orders ED Discharge Orders    None       Pricilla Loveless, MD 08/05/20 2329

## 2020-08-13 ENCOUNTER — Ambulatory Visit: Payer: Commercial Managed Care - PPO | Admitting: Family Medicine

## 2020-08-13 ENCOUNTER — Encounter: Payer: Self-pay | Admitting: Family Medicine

## 2020-08-13 ENCOUNTER — Other Ambulatory Visit: Payer: Self-pay

## 2020-08-13 VITALS — BP 137/84 | HR 100 | Temp 98.4°F | Ht 63.0 in | Wt 175.4 lb

## 2020-08-13 DIAGNOSIS — I10 Essential (primary) hypertension: Secondary | ICD-10-CM

## 2020-08-13 DIAGNOSIS — F411 Generalized anxiety disorder: Secondary | ICD-10-CM | POA: Diagnosis not present

## 2020-08-13 DIAGNOSIS — Z09 Encounter for follow-up examination after completed treatment for conditions other than malignant neoplasm: Secondary | ICD-10-CM | POA: Diagnosis not present

## 2020-08-13 MED ORDER — HYDROXYZINE PAMOATE 25 MG PO CAPS: 25.0000 mg | ORAL_CAPSULE | Freq: Three times a day (TID) | ORAL | 1 refills | Status: DC | PRN

## 2020-08-13 MED ORDER — HYDROCHLOROTHIAZIDE 12.5 MG PO TABS: 12.5000 mg | ORAL_TABLET | Freq: Every day | ORAL | 3 refills | Status: DC

## 2020-08-13 MED ORDER — CITALOPRAM HYDROBROMIDE 20 MG PO TABS: 20.0000 mg | ORAL_TABLET | Freq: Every day | ORAL | 5 refills | Status: DC

## 2020-08-13 NOTE — Progress Notes (Signed)
Subjective:    Patient ID: Brooke Schroeder, female    DOB: 1976/05/14, 44 y.o.   MRN: [id number]  HPI: Brooke Schroeder is a 44 y.o. female presenting on 08/13/2020 for Hypertension   HPI  1. HTN Brooke Schroeder was seen in the ED on 08/05/20 for chest pain that started out of the blue while at work. The pain was worse with certain movements. An EKG showed sinus tachycardia. D-dimer and troponin levels were reassuring against PE and ACS. She had a normal chest xray. She has been checking her BP about 3x a day since then and her BP has been high ranging from 130-169/90s. She has had multiple readings of 150-160s/90. She denies chest pain since her ED visit. Denies shortness of breath, chest pain, dizziness, palpitations, changes in vision, or edema. She has been having daily headaches. She has not been writing down her HR, but doesn't recall it being over 100 when checking her BP.  She has started a [employer] and avoiding packaged and processed foods and salt. She has started exercising. She took a spin class last night. Her hypothyroidism is managed by Dr. Talmage Schroeder. She reports it has been well controlled in the past.   2. Anxiety She reports anxiety. She started taking CBD gummies on 08/02/20 and has noticed some improvement in her symptoms. She reports that she feels anxious now at today's visit. She has a hard time describing how she feels.   GAD 7 : Generalized Anxiety Score 08/13/2020  Nervous, Anxious, on Edge 3  Control/stop worrying 3  Worry too much - different things 3  Trouble relaxing 3  Restless 2  Easily annoyed or irritable 3  Afraid - awful might happen 3  Total GAD 7 Score 20  Anxiety Difficulty Very difficult    Depression screen [facility] 2/9 08/13/2020 08/13/2020 06/14/2018  Decreased Interest 0 0 0  Down, Depressed, Hopeless 1 0 0  PHQ - 2 Score 1 0 0  Altered sleeping 0 - -  Tired, decreased energy 0 - -  Change in appetite 0 - -  Feeling bad or failure about yourself  3 - -    Trouble concentrating 0 - -  Moving slowly or fidgety/restless 0 - -  Suicidal thoughts 0 - -  PHQ-9 Score 4 - -  Difficult doing work/chores Somewhat difficult - -   Relevant past medical, surgical, family and social history reviewed and updated as indicated. Interim medical history since our last visit reviewed. Allergies and medications reviewed and updated.  Review of Systems Per HPI unless specifically indicated above   Allergies as of 08/13/2020      Reactions   Tetanus Toxoids    Had adverse reaction to TDap previously      Medication List       Accurate as of August 13, 2020  8:54 AM. If you have any questions, ask your nurse or doctor.        levothyroxine 88 MCG tablet Commonly known as: SYNTHROID Take 88 mcg by mouth every morning.   liothyronine 5 MCG tablet Commonly known as: CYTOMEL Take 5 mcg by mouth daily.   multivitamin with minerals Tabs tablet Take 1 tablet by mouth daily.   UNABLE TO FIND CBD gummies          Objective:    BP 137/84   Pulse 100   Temp 98.4 F (36.9 C) (Temporal)   Ht 5\' 3"  (1.6 m)   Wt 175 lb 6 oz (  79.5 kg)   BMI 31.07 kg/m   Wt Readings from Last 3 Encounters:  08/13/20 175 lb 6 oz (79.5 kg)  08/05/20 172 lb (78 kg)  06/14/18 164 lb (74.4 kg)    Physical Exam Vitals and nursing note reviewed.  Constitutional:      General: She is not in acute distress.    Appearance: Normal appearance. She is not ill-appearing, toxic-appearing or diaphoretic.  HENT:     Head: Normocephalic and atraumatic.  Eyes:     Extraocular Movements: Extraocular movements intact.     Conjunctiva/sclera: Conjunctivae normal.  Cardiovascular:     Rate and Rhythm: Regular rhythm. Tachycardia present.     Heart sounds: Normal heart sounds. No murmur heard.   Pulmonary:     Effort: Pulmonary effort is normal. No respiratory distress.     Breath sounds: Normal breath sounds.  Musculoskeletal:     Right lower leg: No edema.     Left  lower leg: No edema.  Neurological:     General: No focal deficit present.     Mental Status: She is alert and oriented to person, place, and time.  Psychiatric:        Mood and Affect: Mood normal.        Behavior: Behavior normal.     Results for orders placed or performed during the hospital encounter of 08/05/20  Basic metabolic panel  Result Value Ref Range   Sodium 137 135 - 145 mmol/L   Potassium 4.0 3.5 - 5.1 mmol/L   Chloride 104 98 - 111 mmol/L   CO2 22 22 - 32 mmol/L   Glucose, Bld 99 70 - 99 mg/dL   BUN 15 6 - 20 mg/dL   Creatinine, Ser 5.78 0.44 - 1.00 mg/dL   Calcium 9.1 8.9 - 46.9 mg/dL   GFR, Estimated >62 >95 mL/min   Anion gap 11 5 - 15  CBC  Result Value Ref Range   WBC 10.2 4.0 - 10.5 K/uL   RBC 5.07 3.87 - 5.11 MIL/uL   Hemoglobin 13.4 12.0 - 15.0 g/dL   HCT 28.4 36 - 46 %   MCV 83.4 80.0 - 100.0 fL   MCH 26.4 26.0 - 34.0 pg   MCHC 31.7 30.0 - 36.0 g/dL   RDW 13.2 44.0 - 10.2 %   Platelets 193 150 - 400 K/uL   nRBC 0.0 0.0 - 0.2 %  D-dimer, quantitative (not at [facility])  Result Value Ref Range   D-Dimer, Quant <0.27 0.00 - 0.50 ug/mL-FEU  Pregnancy, urine  Result Value Ref Range   Preg Test, Ur NEGATIVE NEGATIVE  Troponin I (High Sensitivity)  Result Value Ref Range   Troponin I (High Sensitivity) <2 <18 ng/L      Assessment & Plan:    Brooke Schroeder was seen today for hypertension.  Diagnoses and all orders for this visit:  Primary hypertension Continue lifestyle changes. Add medication as below. Keep BP log. Discussed goal of <130/80. Also instructed to keep log of HR as well. HTN and Dash diet handouts given.  -     hydrochlorothiazide (HYDRODIURIL) 12.5 MG tablet; Take 1 tablet (12.5 mg total) by mouth daily.  Generalized anxiety disorder Start celexa daily and vistaril as needed for anxiety.  -     citalopram (CELEXA) 20 MG tablet; Take 1 tablet (20 mg total) by mouth daily. -     hydrOXYzine (VISTARIL) 25 MG capsule; Take 1 capsule (25 mg  total) by mouth 3 (three) times  daily as needed.  Hospital discharge follow-up ED records and lab results reviewed.   Follow up plan: Follow up in 1 month for HTN and anxiety, sooner if needed.   The above assessment and management plan was discussed with the patient. The patient verbalized understanding of and has agreed to the management plan. Patient is aware to call the clinic if symptoms fail to improve or worsen. Patient is aware when to return to the clinic for a follow-up visit. Patient educated on when it is appropriate to go to the emergency department.   Harlow Mares, FNP-C [facility] [address] [place], [place] [address] [phone]

## 2020-08-13 NOTE — Patient Instructions (Addendum)
DASH Eating Plan DASH stands for "Dietary Approaches to Stop Hypertension." The DASH eating plan is a healthy eating plan that has been shown to reduce high blood pressure (hypertension). It may also reduce your risk for type 2 diabetes, heart disease, and stroke. The DASH eating plan may also help with weight loss. What are tips for following this plan?  Hypertension, Adult High blood pressure (hypertension) is when the force of blood pumping through the arteries is too strong. The arteries are the blood vessels that carry blood from the heart throughout the body. Hypertension forces the heart to work harder to pump blood and may cause arteries to become narrow or stiff. Untreated or uncontrolled hypertension can cause a heart attack, heart failure, a stroke, kidney disease, and other problems. A blood pressure reading consists of a higher number over a lower number. Ideally, your blood pressure should be below 120/80. The first ("top") number is called the systolic pressure. It is a measure of the pressure in your arteries as your heart beats. The second ("bottom") number is called the diastolic pressure. It is a measure of the pressure in your arteries as the heart relaxes. What are the causes? The exact cause of this condition is not known. There are some conditions that result in or are related to high blood pressure. What increases the risk? Some risk factors for high blood pressure are under your control. The following factors may make you more likely to develop this condition:  Smoking.  Having type 2 diabetes mellitus, high cholesterol, or both.  Not getting enough exercise or physical activity.  Being overweight.  Having too much fat, sugar, calories, or salt (sodium) in your diet.  Drinking too much alcohol. Some risk factors for high blood pressure may be difficult or impossible to change. Some of these factors include:  Having chronic kidney disease.  Having a family history  of high blood pressure.  Age. Risk increases with age.  Race. You may be at higher risk if you are African American.  Gender. Men are at higher risk than women before age 61. After age 48, women are at higher risk than men.  Having obstructive sleep apnea.  Stress. What are the signs or symptoms? High blood pressure may not cause symptoms. Very high blood pressure (hypertensive crisis) may cause:  Headache.  Anxiety.  Shortness of breath.  Nosebleed.  Nausea and vomiting.  Vision changes.  Severe chest pain.  Seizures. How is this diagnosed? This condition is diagnosed by measuring your blood pressure while you are seated, with your arm resting on a flat surface, your legs uncrossed, and your feet flat on the floor. The cuff of the blood pressure monitor will be placed directly against the skin of your upper arm at the level of your heart. It should be measured at least twice using the same arm. Certain conditions can cause a difference in blood pressure between your right and left arms. Certain factors can cause blood pressure readings to be lower or higher than normal for a short period of time:  When your blood pressure is higher when you are in a health care provider's office than when you are at home, this is called white coat hypertension. Most people with this condition do not need medicines.  When your blood pressure is higher at home than when you are in a health care provider's office, this is called masked hypertension. Most people with this condition may need medicines to control blood pressure. If  you have a high blood pressure reading during one visit or you have normal blood pressure with other risk factors, you may be asked to:  Return on a different day to have your blood pressure checked again.  Monitor your blood pressure at home for 1 week or longer. If you are diagnosed with hypertension, you may have other blood or imaging tests to help your health care  provider understand your overall risk for other conditions. How is this treated? This condition is treated by making healthy lifestyle changes, such as eating healthy foods, exercising more, and reducing your alcohol intake. Your health care provider may prescribe medicine if lifestyle changes are not enough to get your blood pressure under control, and if:  Your systolic blood pressure is above 130.  Your diastolic blood pressure is above 80. Your personal target blood pressure may vary depending on your medical conditions, your age, and other factors. Follow these instructions at home: Eating and drinking   Eat a diet that is high in fiber and potassium, and low in sodium, added sugar, and fat. An example eating plan is called the DASH (Dietary Approaches to Stop Hypertension) diet. To eat this way: ? Eat plenty of fresh fruits and vegetables. Try to fill one half of your plate at each meal with fruits and vegetables. ? Eat whole grains, such as whole-wheat pasta, brown rice, or whole-grain bread. Fill about one fourth of your plate with whole grains. ? Eat or drink low-fat dairy products, such as skim milk or low-fat yogurt. ? Avoid fatty cuts of meat, processed or cured meats, and poultry with skin. Fill about one fourth of your plate with lean proteins, such as fish, chicken without skin, beans, eggs, or tofu. ? Avoid pre-made and processed foods. These tend to be higher in sodium, added sugar, and fat.  Reduce your daily sodium intake. Most people with hypertension should eat less than 1,500 mg of sodium a day.  Do not drink alcohol if: ? Your health care provider tells you not to drink. ? You are pregnant, may be pregnant, or are planning to become pregnant.  If you drink alcohol: ? Limit how much you use to:  0-1 drink a day for women.  0-2 drinks a day for men. ? Be aware of how much alcohol is in your drink. In the [place] one drink equals one 12 oz bottle of beer (355 mL), one  5 oz glass of wine (148 mL), or one 1 oz glass of hard liquor (44 mL). Lifestyle   Work with your health care provider to maintain a healthy body weight or to lose weight. Ask what an ideal weight is for you.  Get at least 30 minutes of exercise most days of the week. Activities may include walking, swimming, or biking.  Include exercise to strengthen your muscles (resistance exercise), such as Pilates or lifting weights, as part of your weekly exercise routine. Try to do these types of exercises for 30 minutes at least 3 days a week.  Do not use any products that contain nicotine or tobacco, such as cigarettes, e-cigarettes, and chewing tobacco. If you need help quitting, ask your health care provider.  Monitor your blood pressure at home as told by your health care provider.  Keep all follow-up visits as told by your health care provider. This is important. Medicines  Take over-the-counter and prescription medicines only as told by your health care provider. Follow directions carefully. Blood pressure medicines must be taken  as prescribed.  Do not skip doses of blood pressure medicine. Doing this puts you at risk for problems and can make the medicine less effective.  Ask your health care provider about side effects or reactions to medicines that you should watch for. Contact a health care provider if you:  Think you are having a reaction to a medicine you are taking.  Have headaches that keep coming back (recurring).  Feel dizzy.  Have swelling in your ankles.  Have trouble with your vision. Get help right away if you:  Develop a severe headache or confusion.  Have unusual weakness or numbness.  Feel faint.  Have severe pain in your chest or abdomen.  Vomit repeatedly.  Have trouble breathing. Summary  Hypertension is when the force of blood pumping through your arteries is too strong. If this condition is not controlled, it may put you at risk for serious  complications.  Your personal target blood pressure may vary depending on your medical conditions, your age, and other factors. For most people, a normal blood pressure is less than 120/80.  Hypertension is treated with lifestyle changes, medicines, or a combination of both. Lifestyle changes include losing weight, eating a healthy, low-sodium diet, exercising more, and limiting alcohol. This information is not intended to replace advice given to you by your health care provider. Make sure you discuss any questions you have with your health care provider. Document Revised: 05/23/2018 Document Reviewed: 05/23/2018 Elsevier Patient Education  2020 [employer] Inc[employer]  General guidelines  Avoid eating more than 2,300 mg (milligrams) of salt (sodium) a day. If you have hypertension, you may need to reduce your sodium intake to 1,500 mg a day.  Limit alcohol intake to no more than 1 drink a day for nonpregnant women and 2 drinks a day for men. One drink equals 12 oz of beer, 5 oz of wine, or 1 oz of hard liquor.  Work with your health care provider to maintain a healthy body weight or to lose weight. Ask what an ideal weight is for you.  Get at least 30 minutes of exercise that causes your heart to beat faster (aerobic exercise) most days of the week. Activities may include walking, swimming, or biking.  Work with your health care provider or diet and nutrition specialist (dietitian) to adjust your eating plan to your individual calorie needs. Reading food labels   Check food labels for the amount of sodium per serving. Choose foods with less than 5 percent of the Daily Value of sodium. Generally, foods with less than 300 mg of sodium per serving fit into this eating plan.  To find whole grains, look for the word "whole" as the first word in the ingredient list. Shopping  Buy products labeled as "low-sodium" or "no salt added."  Buy fresh foods. Avoid canned foods and premade or frozen  meals. Cooking  Avoid adding salt when cooking. Use salt-free seasonings or herbs instead of table salt or sea salt. Check with your health care provider or pharmacist before using salt substitutes.  Do not fry foods. Cook foods using healthy methods such as baking, boiling, grilling, and broiling instead.  Cook with heart-healthy oils, such as olive, canola, soybean, or sunflower oil. Meal planning  Eat a balanced diet that includes: ? 5 or more servings of fruits and vegetables each day. At each meal, try to fill half of your plate with fruits and vegetables. ? Up to 6-8 servings of whole grains each day. ? Less than  6 oz of lean meat, poultry, or fish each day. A 3-oz serving of meat is about the same size as a deck of cards. One egg equals 1 oz. ? 2 servings of low-fat dairy each day. ? A serving of nuts, seeds, or beans 5 times each week. ? Heart-healthy fats. Healthy fats called Omega-3 fatty acids are found in foods such as flaxseeds and coldwater fish, like sardines, salmon, and mackerel.  Limit how much you eat of the following: ? Canned or prepackaged foods. ? Food that is high in trans fat, such as fried foods. ? Food that is high in saturated fat, such as fatty meat. ? Sweets, desserts, sugary drinks, and other foods with added sugar. ? Full-fat dairy products.  Do not salt foods before eating.  Try to eat at least 2 vegetarian meals each week.  Eat more home-cooked food and less restaurant, buffet, and fast food.  When eating at a restaurant, ask that your food be prepared with less salt or no salt, if possible. What foods are recommended? The items listed may not be a complete list. Talk with your dietitian about what dietary choices are best for you. Grains Whole-grain or whole-wheat bread. Whole-grain or whole-wheat pasta. Brown rice. [place]. Bulgur. Whole-grain and low-sodium cereals. Pita bread. Low-fat, low-sodium crackers. Whole-wheat flour  tortillas. Vegetables Fresh or frozen vegetables (raw, steamed, roasted, or grilled). Low-sodium or reduced-sodium tomato and vegetable juice. Low-sodium or reduced-sodium tomato sauce and tomato paste. Low-sodium or reduced-sodium canned vegetables. Fruits All fresh, dried, or frozen fruit. Canned fruit in natural juice (without added sugar). Meat and other protein foods Skinless chicken or [place]. Ground chicken or [place]. Pork with fat trimmed off. Fish and seafood. Egg whites. Dried beans, peas, or lentils. Unsalted nuts, nut butters, and seeds. Unsalted canned beans. Lean cuts of beef with fat trimmed off. Low-sodium, lean deli meat. Dairy Low-fat (1%) or fat-free (skim) milk. Fat-free, low-fat, or reduced-fat cheeses. Nonfat, low-sodium ricotta or cottage cheese. Low-fat or nonfat yogurt. Low-fat, low-sodium cheese. Fats and oils Soft margarine without trans fats. Vegetable oil. Low-fat, reduced-fat, or light mayonnaise and salad dressings (reduced-sodium). Canola, safflower, olive, soybean, and sunflower oils. Avocado. Seasoning and other foods Herbs. Spices. Seasoning mixes without salt. Unsalted popcorn and pretzels. Fat-free sweets. What foods are not recommended? The items listed may not be a complete list. Talk with your dietitian about what dietary choices are best for you. Grains Baked goods made with fat, such as croissants, muffins, or some breads. Dry pasta or rice meal packs. Vegetables Creamed or fried vegetables. Vegetables in a cheese sauce. Regular canned vegetables (not low-sodium or reduced-sodium). Regular canned tomato sauce and paste (not low-sodium or reduced-sodium). Regular tomato and vegetable juice (not low-sodium or reduced-sodium). Angie Fava. Olives. Fruits Canned fruit in a light or heavy syrup. Fried fruit. Fruit in cream or butter sauce. Meat and other protein foods Fatty cuts of meat. Ribs. Fried meat. Berniece Salines. Sausage. Bologna and other processed lunch meats.  Salami. Fatback. Hotdogs. Bratwurst. Salted nuts and seeds. Canned beans with added salt. Canned or smoked fish. Whole eggs or egg yolks. Chicken or [place] with skin. Dairy Whole or 2% milk, cream, and half-and-half. Whole or full-fat cream cheese. Whole-fat or sweetened yogurt. Full-fat cheese. Nondairy creamers. Whipped toppings. Processed cheese and cheese spreads. Fats and oils Butter. Stick margarine. Lard. Shortening. Ghee. Bacon fat. Tropical oils, such as coconut, palm kernel, or palm oil. Seasoning and other foods Salted popcorn and pretzels. Onion salt, garlic salt,  seasoned salt, table salt, and sea salt. Worcestershire sauce. Tartar sauce. Barbecue sauce. Teriyaki sauce. Soy sauce, including reduced-sodium. Steak sauce. Canned and packaged gravies. Fish sauce. Oyster sauce. Cocktail sauce. Horseradish that you find on the shelf. Ketchup. Mustard. Meat flavorings and tenderizers. Bouillon cubes. Hot sauce and Tabasco sauce. Premade or packaged marinades. Premade or packaged taco seasonings. Relishes. Regular salad dressings. Where to find more information:  National Heart, Lung, and Blood Institute: PopSteam.is  American Heart Association: www.heart.org Summary  The DASH eating plan is a healthy eating plan that has been shown to reduce high blood pressure (hypertension). It may also reduce your risk for type 2 diabetes, heart disease, and stroke.  With the DASH eating plan, you should limit salt (sodium) intake to 2,300 mg a day. If you have hypertension, you may need to reduce your sodium intake to 1,500 mg a day.  When on the DASH eating plan, aim to eat more fresh fruits and vegetables, whole grains, lean proteins, low-fat dairy, and heart-healthy fats.  Work with your health care provider or diet and nutrition specialist (dietitian) to adjust your eating plan to your individual calorie needs. This information is not intended to replace advice given to you by your health  care provider. Make sure you discuss any questions you have with your health care provider. Document Revised: 08/25/2017 Document Reviewed: 09/05/2016 Elsevier Patient Education  2020 [employer].

## 2020-09-04 ENCOUNTER — Other Ambulatory Visit: Payer: Self-pay | Admitting: Family Medicine

## 2020-09-04 DIAGNOSIS — F411 Generalized anxiety disorder: Secondary | ICD-10-CM

## 2020-09-10 ENCOUNTER — Encounter: Payer: Self-pay | Admitting: Family Medicine

## 2020-09-10 ENCOUNTER — Ambulatory Visit: Payer: Commercial Managed Care - PPO | Admitting: Family Medicine

## 2020-09-10 ENCOUNTER — Other Ambulatory Visit: Payer: Self-pay

## 2020-09-10 VITALS — BP 119/75 | HR 76 | Temp 98.3°F | Ht 63.0 in | Wt 173.2 lb

## 2020-09-10 DIAGNOSIS — F411 Generalized anxiety disorder: Secondary | ICD-10-CM

## 2020-09-10 DIAGNOSIS — I10 Essential (primary) hypertension: Secondary | ICD-10-CM | POA: Diagnosis not present

## 2020-09-10 DIAGNOSIS — E039 Hypothyroidism, unspecified: Secondary | ICD-10-CM | POA: Diagnosis not present

## 2020-09-10 MED ORDER — BUPROPION HCL ER (XL) 150 MG PO TB24: 150.0000 mg | ORAL_TABLET | Freq: Every day | ORAL | 2 refills | Status: DC

## 2020-09-10 NOTE — Progress Notes (Signed)
Subjective: CC: BP check PCP: Brooke Ip, DO  JKK:[id number] Brooke Schroeder is a 44 y.o. female presenting to clinic today for:  1. BP check Brooke Schroeder has been taking HCTZ 12.5 for about a month now. She reports that she is tolerating her medication well. She has been checking her BP while at work. 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 Her BP is usually 140-150s/80s. The monitor she is using is about 46 years old. She denies chest pain, shortness of breath, dizziness, weakness, blurred vision, palpitations, or edema.    2. Anxiety She has been taking Celexa daily for 4 weeks. She reports that she is feeling better but is having difficulty reaching orgasm now. She has not tried vistaril as she is concerned about possible drowsiness. She reports that her anxiety is better however she still experiences a lot of anxiety while at work.   GAD 7 : Generalized Anxiety Score 09/10/2020 08/13/2020  Nervous, Anxious, on Edge 1 3  Control/stop worrying 0 3  Worry too much - different things 0 3  Trouble relaxing 0 3  Restless 0 2  Easily annoyed or irritable 0 3  Afraid - awful might happen 0 3  Total GAD 7 Score 1 20  Anxiety Difficulty Not difficult at all Very difficult    Depression screen [facility] 2/9 09/10/2020 08/13/2020 08/13/2020  Decreased Interest 0 0 0  Down, Depressed, Hopeless 0 1 0  PHQ - 2 Score 0 1 0  Altered sleeping 0 0 -  Tired, decreased energy 2 0 -  Change in appetite 0 0 -  Feeling bad or failure about yourself  0 3 -  Trouble concentrating 0 0 -  Moving slowly or fidgety/restless 0 0 -  Suicidal thoughts 0 0 -  PHQ-9 Score 2 4 -  Difficult doing work/chores Not difficult at all Somewhat difficult -    Relevant past medical, surgical, family, and social history reviewed and updated as indicated.  Allergies and medications reviewed and updated.  Allergies  Allergen Reactions  . Tetanus Toxoids     Had adverse reaction to TDap  previously   Past Medical History:  Diagnosis Date  . Thyroid disease   . UTI (lower urinary tract infection)     Current Outpatient Medications:  .  citalopram (CELEXA) 20 MG tablet, TAKE 1 TABLET BY MOUTH EVERY DAY, Disp: 90 tablet, Rfl: 2 .  hydrochlorothiazide (HYDRODIURIL) 12.5 MG tablet, Take 1 tablet (12.5 mg total) by mouth daily., Disp: 90 tablet, Rfl: 3 .  hydrOXYzine (VISTARIL) 25 MG capsule, Take 1 capsule (25 mg total) by mouth 3 (three) times daily as needed., Disp: 30 capsule, Rfl: 1 .  levothyroxine (SYNTHROID) 88 MCG tablet, Take 88 mcg by mouth every morning., Disp: , Rfl:  .  liothyronine (CYTOMEL) 5 MCG tablet, Take 5 mcg by mouth daily., Disp: , Rfl:  .  Multiple Vitamin (MULTIVITAMIN WITH MINERALS) TABS tablet, Take 1 tablet by mouth daily., Disp: , Rfl:  .  UNABLE TO FIND, CBD gummies, Disp: , Rfl:  Social History   Socioeconomic History  . Marital status: Married    Spouse name: Not on file  . Number of children: Not on file  . Years of education: Not on file  . Highest education level: Not on file  Occupational History  . Not on file  Tobacco Use  . Smoking status: Never Smoker  . Smokeless tobacco: Never Used  Substance and  Sexual Activity  . Alcohol use: No  . Drug use: No  . Sexual activity: Not on file  Other Topics Concern  . Not on file  Social History Narrative  . Not on file   Social Determinants of Health   Financial Resource Strain: Not on file  Food Insecurity: Not on file  Transportation Needs: Not on file  Physical Activity: Not on file  Stress: Not on file  Social Connections: Not on file  Intimate Partner Violence: Not on file   No family history on file.  Review of Systems  Negative unless specially indicated above in HPI.  Objective: Office vital signs reviewed. BP 119/75   Pulse 76   Temp 98.3 F (36.8 C) (Temporal)   Ht 5\' 3"  (1.6 m)   Wt 173 lb 4 oz (78.6 kg)   BMI 30.69 kg/m   Physical Examination:   Physical Exam Vitals and nursing note reviewed.  Constitutional:      General: She is not in acute distress.    Appearance: Normal appearance. She is not ill-appearing, toxic-appearing or diaphoretic.  Neck:     Vascular: No carotid bruit.  Cardiovascular:     Rate and Rhythm: Normal rate and regular rhythm.     Heart sounds: Normal heart sounds. No murmur heard.   Pulmonary:     Effort: Pulmonary effort is normal. No respiratory distress.     Breath sounds: Normal breath sounds.  Musculoskeletal:     Cervical back: No rigidity or tenderness.     Right lower leg: No edema.     Left lower leg: No edema.  Skin:    General: Skin is warm and dry.  Neurological:     General: No focal deficit present.     Mental Status: She is alert and oriented to person, place, and time.  Psychiatric:        Mood and Affect: Mood normal.        Behavior: Behavior normal.      Results for orders placed or performed during the hospital encounter of 08/05/20  Basic metabolic panel  Result Value Ref Range   Sodium 137 135 - 145 mmol/L   Potassium 4.0 3.5 - 5.1 mmol/L   Chloride 104 98 - 111 mmol/L   CO2 22 22 - 32 mmol/L   Glucose, Bld 99 70 - 99 mg/dL   BUN 15 6 - 20 mg/dL   Creatinine, Ser 13/10/21 0.44 - 1.00 mg/dL   Calcium 9.1 8.9 - 4.66 mg/dL   GFR, Estimated 59.9 >35 mL/min   Anion gap 11 5 - 15  CBC  Result Value Ref Range   WBC 10.2 4.0 - 10.5 K/uL   RBC 5.07 3.87 - 5.11 MIL/uL   Hemoglobin 13.4 12.0 - 15.0 g/dL   HCT >70 17.7 - 93.9 %   MCV 83.4 80.0 - 100.0 fL   MCH 26.4 26.0 - 34.0 pg   MCHC 31.7 30.0 - 36.0 g/dL   RDW 03.0 09.2 - 33.0 %   Platelets 193 150 - 400 K/uL   nRBC 0.0 0.0 - 0.2 %  D-dimer, quantitative (not at [facility])  Result Value Ref Range   D-Dimer, Quant <0.27 0.00 - 0.50 ug/mL-FEU  Pregnancy, urine  Result Value Ref Range   Preg Test, Ur NEGATIVE NEGATIVE  Troponin I (High Sensitivity)  Result Value Ref Range   Troponin I (High Sensitivity) <2 <18 ng/L      Assessment/ Plan: Brooke Schroeder was seen today for hypertension.  Diagnoses and all orders for this visit:  Generalized anxiety disorder Improving scores. Discussed adding Wellbutrin to Celexa, however she would prefer to try Wellbutrin alone. Discusses trying visatril in the evening at home to see if it makes her drowsy. If no drowsiness, she can take this during the day to help with anxiety at work.  -     buPROPion (WELLBUTRIN XL) 150 MG 24 hr tablet; Take 1 tablet (150 mg total) by mouth daily. -     Vitamin B12 -     VITAMIN D 25 Hydroxy (Vit-D Deficiency, Fractures)  Primary hypertension Labs pending as below. BP 119/75 today in office but is elevated at home. She is checking BP while at work which is where is she having most of her anxiety. Discussed checking BP at home where she is more relaxed. Also discussed that elevated reading could be due to her older machine. She will bring her home machine to her next appointment. Follow up with nurse visit in 2 weeks for recheck BP.  -     Lipid panel -     TSH -     Vitamin B12 -     VITAMIN D 25 Hydroxy (Vit-D Deficiency, Fractures)  Hypothyroidism, unspecified type Labs pending.  -     TSH  Follow up in 2 week for nurse visit for BP check. Bring home monitor and BP log.   The above assessment and management plan was discussed with the patient. The patient verbalized understanding of and has agreed to the management plan. Patient is aware to call the clinic if symptoms persist or worsen. Patient is aware when to return to the clinic for a follow-up visit. Patient educated on when it is appropriate to go to the emergency department.   Harlow Mares, FNP-C [facility] [address] [place], [place] [address] [phone]

## 2020-09-10 NOTE — Patient Instructions (Signed)
Managing Anxiety, Adult After being diagnosed with an anxiety disorder, you may be relieved to know why you have felt or behaved a certain way. You may also feel overwhelmed about the treatment ahead and what it will mean for your life. With care and support, you can manage this condition and recover from it. How to manage lifestyle changes Managing stress and anxiety  Stress is your body's reaction to life changes and events, both good and bad. Most stress will last just a few hours, but stress can be ongoing and can lead to more than just stress. Although stress can play a major role in anxiety, it is not the same as anxiety. Stress is usually caused by something external, such as a deadline, test, or competition. Stress normally passes after the triggering event has ended.  Anxiety is caused by something internal, such as imagining a terrible outcome or worrying that something will go wrong that will devastate you. Anxiety often does not go away even after the triggering event is over, and it can become long-term (chronic) worry. It is important to understand the differences between stress and anxiety and to manage your stress effectively so that it does not lead to an anxious response. Talk with your health care provider or a counselor to learn more about reducing anxiety and stress. He or she may suggest tension reduction techniques, such as:  Music therapy. This can include creating or listening to music that you enjoy and that inspires you.  Mindfulness-based meditation. This involves being aware of your normal breaths while not trying to control your breathing. It can be done while sitting or walking.  Centering prayer. This involves focusing on a word, phrase, or sacred image that means something to you and brings you peace.  Deep breathing. To do this, expand your stomach and inhale slowly through your nose. Hold your breath for 3-5 seconds. Then exhale slowly, letting your stomach muscles  relax.  Self-talk. This involves identifying thought patterns that lead to anxiety reactions and changing those patterns.  Muscle relaxation. This involves tensing muscles and then relaxing them. Choose a tension reduction technique that suits your lifestyle and personality. These techniques take time and practice. Set aside 5-15 minutes a day to do them. Therapists can offer counseling and training in these techniques. The training to help with anxiety may be covered by some insurance plans. Other things you can do to manage stress and anxiety include:  Keeping a stress/anxiety diary. This can help you learn what triggers your reaction and then learn ways to manage your response.  Thinking about how you react to certain situations. You may not be able to control everything, but you can control your response.  Making time for activities that help you relax and not feeling guilty about spending your time in this way.  Visual imagery and yoga can help you stay calm and relax.  Medicines Medicines can help ease symptoms. Medicines for anxiety include:  Anti-anxiety drugs.  Antidepressants. Medicines are often used as a primary treatment for anxiety disorder. Medicines will be prescribed by a health care provider. When used together, medicines, psychotherapy, and tension reduction techniques may be the most effective treatment. Relationships Relationships can play a big part in helping you recover. Try to spend more time connecting with trusted friends and family members. Consider going to couples counseling, taking family education classes, or going to family therapy. Therapy can help you and others better understand your condition. How to recognize changes in your   anxiety Everyone responds differently to treatment for anxiety. Recovery from anxiety happens when symptoms decrease and stop interfering with your daily activities at home or work. This may mean that you will start to:  Have  better concentration and focus. Worry will interfere less in your daily thinking.  Sleep better.  Be less irritable.  Have more energy.  Have improved memory. It is important to recognize when your condition is getting worse. Contact your health care provider if your symptoms interfere with home or work and you feel like your condition is not improving. Follow these instructions at home: Activity  Exercise. Most adults should do the following: ? Exercise for at least 150 minutes each week. The exercise should increase your heart rate and make you sweat (moderate-intensity exercise). ? Strengthening exercises at least twice a week.  Get the right amount and quality of sleep. Most adults need 7-9 hours of sleep each night. Lifestyle   Eat a healthy diet that includes plenty of vegetables, fruits, whole grains, low-fat dairy products, and lean protein. Do not eat a lot of foods that are high in solid fats, added sugars, or salt.  Make choices that simplify your life.  Do not use any products that contain nicotine or tobacco, such as cigarettes, e-cigarettes, and chewing tobacco. If you need help quitting, ask your health care provider.  Avoid caffeine, alcohol, and certain over-the-counter cold medicines. These may make you feel worse. Ask your pharmacist which medicines to avoid. General instructions  Take over-the-counter and prescription medicines only as told by your health care provider.  Keep all follow-up visits as told by your health care provider. This is important. Where to find support You can get help and support from these sources:  Self-help groups.  Online and community organizations.  A trusted spiritual leader.  Couples counseling.  Family education classes.  Family therapy. Where to find more information You may find that joining a support group helps you deal with your anxiety. The following sources can help you locate counselors or support groups near  you:  Mental Health America: www.mentalhealthamerica.net  Anxiety and Depression Association of America (ADAA): www.adaa.org  National Alliance on Mental Illness (NAMI): www.nami.org Contact a health care provider if you:  Have a hard time staying focused or finishing daily tasks.  Spend many hours a day feeling worried about everyday life.  Become exhausted by worry.  Start to have headaches, feel tense, or have nausea.  Urinate more than normal.  Have diarrhea. Get help right away if you have:  A racing heart and shortness of breath.  Thoughts of hurting yourself or others. If you ever feel like you may hurt yourself or others, or have thoughts about taking your own life, get help right away. You can go to your nearest emergency department or call:  Your local emergency services (911 in the [place]  A suicide crisis helpline, such as the National Suicide Prevention Lifeline at 1-800-273-8255. This is open 24 hours a day. Summary  Taking steps to learn and use tension reduction techniques can help calm you and help prevent triggering an anxiety reaction.  When used together, medicines, psychotherapy, and tension reduction techniques may be the most effective treatment.  Family, friends, and partners can play a big part in helping you recover from an anxiety disorder. This information is not intended to replace advice given to you by your health care provider. Make sure you discuss any questions you have with your health care provider. Document Revised:   02/12/2019 Document Reviewed: 02/12/2019 Elsevier Patient Education  2020 [employer]. Hypertension, Adult High blood pressure (hypertension) is when the force of blood pumping through the arteries is too strong. The arteries are the blood vessels that carry blood from the heart throughout the body. Hypertension forces the heart to work harder to pump blood and may cause arteries to become narrow or stiff. Untreated or  uncontrolled hypertension can cause a heart attack, heart failure, a stroke, kidney disease, and other problems. A blood pressure reading consists of a higher number over a lower number. Ideally, your blood pressure should be below 120/80. The first ("top") number is called the systolic pressure. It is a measure of the pressure in your arteries as your heart beats. The second ("bottom") number is called the diastolic pressure. It is a measure of the pressure in your arteries as the heart relaxes. What are the causes? The exact cause of this condition is not known. There are some conditions that result in or are related to high blood pressure. What increases the risk? Some risk factors for high blood pressure are under your control. The following factors may make you more likely to develop this condition:  Smoking.  Having type 2 diabetes mellitus, high cholesterol, or both.  Not getting enough exercise or physical activity.  Being overweight.  Having too much fat, sugar, calories, or salt (sodium) in your diet.  Drinking too much alcohol. Some risk factors for high blood pressure may be difficult or impossible to change. Some of these factors include:  Having chronic kidney disease.  Having a family history of high blood pressure.  Age. Risk increases with age.  Race. You may be at higher risk if you are African American.  Gender. Men are at higher risk than women before age 62. After age 19, women are at higher risk than men.  Having obstructive sleep apnea.  Stress. What are the signs or symptoms? High blood pressure may not cause symptoms. Very high blood pressure (hypertensive crisis) may cause:  Headache.  Anxiety.  Shortness of breath.  Nosebleed.  Nausea and vomiting.  Vision changes.  Severe chest pain.  Seizures. How is this diagnosed? This condition is diagnosed by measuring your blood pressure while you are seated, with your arm resting on a flat  surface, your legs uncrossed, and your feet flat on the floor. The cuff of the blood pressure monitor will be placed directly against the skin of your upper arm at the level of your heart. It should be measured at least twice using the same arm. Certain conditions can cause a difference in blood pressure between your right and left arms. Certain factors can cause blood pressure readings to be lower or higher than normal for a short period of time:  When your blood pressure is higher when you are in a health care provider's office than when you are at home, this is called white coat hypertension. Most people with this condition do not need medicines.  When your blood pressure is higher at home than when you are in a health care provider's office, this is called masked hypertension. Most people with this condition may need medicines to control blood pressure. If you have a high blood pressure reading during one visit or you have normal blood pressure with other risk factors, you may be asked to:  Return on a different day to have your blood pressure checked again.  Monitor your blood pressure at home for 1 week or longer.  If you are diagnosed with hypertension, you may have other blood or imaging tests to help your health care provider understand your overall risk for other conditions. How is this treated? This condition is treated by making healthy lifestyle changes, such as eating healthy foods, exercising more, and reducing your alcohol intake. Your health care provider may prescribe medicine if lifestyle changes are not enough to get your blood pressure under control, and if:  Your systolic blood pressure is above 130.  Your diastolic blood pressure is above 80. Your personal target blood pressure may vary depending on your medical conditions, your age, and other factors. Follow these instructions at home: Eating and drinking   Eat a diet that is high in fiber and potassium, and low in  sodium, added sugar, and fat. An example eating plan is called the DASH (Dietary Approaches to Stop Hypertension) diet. To eat this way: ? Eat plenty of fresh fruits and vegetables. Try to fill one half of your plate at each meal with fruits and vegetables. ? Eat whole grains, such as whole-wheat pasta, brown rice, or whole-grain bread. Fill about one fourth of your plate with whole grains. ? Eat or drink low-fat dairy products, such as skim milk or low-fat yogurt. ? Avoid fatty cuts of meat, processed or cured meats, and poultry with skin. Fill about one fourth of your plate with lean proteins, such as fish, chicken without skin, beans, eggs, or tofu. ? Avoid pre-made and processed foods. These tend to be higher in sodium, added sugar, and fat.  Reduce your daily sodium intake. Most people with hypertension should eat less than 1,500 mg of sodium a day.  Do not drink alcohol if: ? Your health care provider tells you not to drink. ? You are pregnant, may be pregnant, or are planning to become pregnant.  If you drink alcohol: ? Limit how much you use to:  0-1 drink a day for women.  0-2 drinks a day for men. ? Be aware of how much alcohol is in your drink. In the [place] one drink equals one 12 oz bottle of beer (355 mL), one 5 oz glass of wine (148 mL), or one 1 oz glass of hard liquor (44 mL). Lifestyle   Work with your health care provider to maintain a healthy body weight or to lose weight. Ask what an ideal weight is for you.  Get at least 30 minutes of exercise most days of the week. Activities may include walking, swimming, or biking.  Include exercise to strengthen your muscles (resistance exercise), such as Pilates or lifting weights, as part of your weekly exercise routine. Try to do these types of exercises for 30 minutes at least 3 days a week.  Do not use any products that contain nicotine or tobacco, such as cigarettes, e-cigarettes, and chewing tobacco. If you need help  quitting, ask your health care provider.  Monitor your blood pressure at home as told by your health care provider.  Keep all follow-up visits as told by your health care provider. This is important. Medicines  Take over-the-counter and prescription medicines only as told by your health care provider. Follow directions carefully. Blood pressure medicines must be taken as prescribed.  Do not skip doses of blood pressure medicine. Doing this puts you at risk for problems and can make the medicine less effective.  Ask your health care provider about side effects or reactions to medicines that you should watch for. Contact a health care provider if  you:  Think you are having a reaction to a medicine you are taking.  Have headaches that keep coming back (recurring).  Feel dizzy.  Have swelling in your ankles.  Have trouble with your vision. Get help right away if you:  Develop a severe headache or confusion.  Have unusual weakness or numbness.  Feel faint.  Have severe pain in your chest or abdomen.  Vomit repeatedly.  Have trouble breathing. Summary  Hypertension is when the force of blood pumping through your arteries is too strong. If this condition is not controlled, it may put you at risk for serious complications.  Your personal target blood pressure may vary depending on your medical conditions, your age, and other factors. For most people, a normal blood pressure is less than 120/80.  Hypertension is treated with lifestyle changes, medicines, or a combination of both. Lifestyle changes include losing weight, eating a healthy, low-sodium diet, exercising more, and limiting alcohol. This information is not intended to replace advice given to you by your health care provider. Make sure you discuss any questions you have with your health care provider. Document Revised: 05/23/2018 Document Reviewed: 05/23/2018 Elsevier Patient Education  2020 [employer].

## 2020-09-11 ENCOUNTER — Other Ambulatory Visit: Payer: Self-pay | Admitting: Family Medicine

## 2020-09-11 DIAGNOSIS — E559 Vitamin D deficiency, unspecified: Secondary | ICD-10-CM

## 2020-09-11 LAB — VITAMIN D 25 HYDROXY (VIT D DEFICIENCY, FRACTURES): Vit D, 25-Hydroxy: 27.2 ng/mL — ABNORMAL LOW (ref 30.0–100.0)

## 2020-09-11 LAB — LIPID PANEL
Chol/HDL Ratio: 2.9 ratio (ref 0.0–4.4)
Cholesterol, Total: 207 mg/dL — ABNORMAL HIGH (ref 100–199)
HDL: 72 mg/dL (ref >39)
LDL Chol Calc (NIH): 116 mg/dL — ABNORMAL HIGH (ref 0–99)
Triglycerides: 111 mg/dL (ref 0–149)
VLDL Cholesterol Cal: 19 mg/dL (ref 5–40)

## 2020-09-11 LAB — VITAMIN B12: Vitamin B-12: 698 pg/mL (ref 232–1245)

## 2020-09-11 LAB — TSH: TSH: 0.814 u[IU]/mL (ref 0.450–4.500)

## 2020-09-11 MED ORDER — VITAMIN D (ERGOCALCIFEROL) 1.25 MG (50000 UNIT) PO CAPS: 50000.0000 [IU] | ORAL_CAPSULE | ORAL | 0 refills | Status: DC

## 2020-09-30 ENCOUNTER — Other Ambulatory Visit: Payer: Self-pay

## 2020-09-30 ENCOUNTER — Ambulatory Visit (INDEPENDENT_AMBULATORY_CARE_PROVIDER_SITE_OTHER): Payer: Commercial Managed Care - PPO

## 2020-09-30 DIAGNOSIS — I10 Essential (primary) hypertension: Secondary | ICD-10-CM

## 2020-09-30 NOTE — Progress Notes (Signed)
Patient presented to office today for a BP check and also to compare our BP machine with her personal machine. Her BP with our machine was 127/61 with a pulse of 86 and with her machine it measured 149/80. Advised patient she should purchase a new machine. She would like to know when you want to see her again for follow up. Please advise

## 2020-09-30 NOTE — Progress Notes (Signed)
Glad to hear that her blood pressure is actually controlled and she simply needs new equipment.  She can follow-up with me as needed for her annual physical exam with fasting labs.

## 2020-10-06 ENCOUNTER — Other Ambulatory Visit: Payer: Self-pay | Admitting: Family Medicine

## 2020-10-06 DIAGNOSIS — E559 Vitamin D deficiency, unspecified: Secondary | ICD-10-CM

## 2020-11-04 ENCOUNTER — Other Ambulatory Visit: Payer: Self-pay | Admitting: Family Medicine

## 2020-11-04 DIAGNOSIS — E559 Vitamin D deficiency, unspecified: Secondary | ICD-10-CM

## 2021-05-26 ENCOUNTER — Encounter: Payer: Self-pay | Admitting: Family Medicine

## 2021-05-26 ENCOUNTER — Ambulatory Visit: Payer: Commercial Managed Care - PPO | Admitting: Family Medicine

## 2021-05-26 ENCOUNTER — Other Ambulatory Visit: Payer: Self-pay

## 2021-05-26 VITALS — BP 132/83 | HR 79 | Temp 98.2°F | Ht 63.0 in | Wt 176.4 lb

## 2021-05-26 DIAGNOSIS — R300 Dysuria: Secondary | ICD-10-CM

## 2021-05-26 LAB — MICROSCOPIC EXAMINATION: Renal Epithel, UA: NONE SEEN /hpf

## 2021-05-26 LAB — URINALYSIS, ROUTINE W REFLEX MICROSCOPIC
Bilirubin, UA: NEGATIVE
Glucose, UA: NEGATIVE
Ketones, UA: NEGATIVE
Nitrite, UA: NEGATIVE
Protein,UA: NEGATIVE
Specific Gravity, UA: <1.005 — ABNORMAL LOW (ref 1.005–1.030)
Urobilinogen, Ur: 0.2 mg/dL (ref 0.2–1.0)
pH, UA: 5.5 (ref 5.0–7.5)

## 2021-05-26 MED ORDER — SULFAMETHOXAZOLE-TRIMETHOPRIM 800-160 MG PO TABS: 1.0000 | ORAL_TABLET | Freq: Two times a day (BID) | ORAL | 0 refills | Status: AC

## 2021-05-26 NOTE — Progress Notes (Signed)
Acute Office Visit  Subjective:    Patient ID: Brooke Schroeder, female    DOB: 05-22-1976, 45 y.o.   MRN: [id number]  Chief Complaint  Patient presents with   Dysuria    HPI Patient is in today for dysuria, urgency, and frequency for the last 2 days. Denies fever, chills, nausea, vomiting, abdominal pain, chills, flank pain, or blood in urine. Denies vaginal symptoms. She has not tried any remedies.   Past Medical History:  Diagnosis Date   Thyroid disease    UTI (lower urinary tract infection)     No past surgical history on file.  No family history on file.  Social History   Socioeconomic History   Marital status: Married    Spouse name: Not on file   Number of children: Not on file   Years of education: Not on file   Highest education level: Not on file  Occupational History   Not on file  Tobacco Use   Smoking status: Never   Smokeless tobacco: Never  Substance and Sexual Activity   Alcohol use: No   Drug use: No   Sexual activity: Not on file  Other Topics Concern   Not on file  Social History Narrative   Not on file   Social Determinants of Health   Financial Resource Strain: Not on file  Food Insecurity: Not on file  Transportation Needs: Not on file  Physical Activity: Not on file  Stress: Not on file  Social Connections: Not on file  Intimate Partner Violence: Not on file    Outpatient Medications Prior to Visit  Medication Sig Dispense Refill   buPROPion (WELLBUTRIN XL) 150 MG 24 hr tablet Take 1 tablet (150 mg total) by mouth daily. 90 tablet 2   hydrochlorothiazide (HYDRODIURIL) 12.5 MG tablet Take 1 tablet (12.5 mg total) by mouth daily. 90 tablet 3   levothyroxine (SYNTHROID) 112 MCG tablet Take 112 mcg by mouth every morning.     Multiple Vitamin (MULTIVITAMIN WITH MINERALS) TABS tablet Take 1 tablet by mouth daily.     UNABLE TO FIND CBD gummies     levothyroxine (SYNTHROID) 88 MCG tablet Take 88 mcg by mouth every morning.      hydrOXYzine (VISTARIL) 25 MG capsule Take 1 capsule (25 mg total) by mouth 3 (three) times daily as needed. (Patient not taking: Reported on 05/26/2021) 30 capsule 1   liothyronine (CYTOMEL) 5 MCG tablet Take 5 mcg by mouth daily. (Patient not taking: Reported on 05/26/2021)     Vitamin D, Ergocalciferol, (DRISDOL) 1.25 MG (50000 UNIT) CAPS capsule Take 1 capsule (50,000 Units total) by mouth every 7 (seven) days. 8 capsule 0   No facility-administered medications prior to visit.    Allergies  Allergen Reactions   Tetanus Toxoids     Had adverse reaction to TDap previously    Review of Systems As per HPI.    Objective:    Physical Exam Vitals and nursing note reviewed.  Constitutional:      General: She is not in acute distress.    Appearance: She is not ill-appearing, toxic-appearing or diaphoretic.  Abdominal:     General: There is no distension.     Palpations: Abdomen is soft.     Tenderness: There is no abdominal tenderness. There is no right CVA tenderness, left CVA tenderness, guarding or rebound.  Musculoskeletal:     Right lower leg: No edema.     Left lower leg: No edema.  Skin:  General: Skin is warm and dry.  Neurological:     General: No focal deficit present.     Mental Status: She is alert and oriented to person, place, and time.  Psychiatric:        Mood and Affect: Mood normal.        Behavior: Behavior normal.    BP 132/83   Pulse 79   Temp 98.2 F (36.8 C) (Temporal)   Ht 5\' 3"  (1.6 m)   Wt 176 lb 6 oz (80 kg)   BMI 31.24 kg/m  Wt Readings from Last 3 Encounters:  05/26/21 176 lb 6 oz (80 kg)  09/10/20 173 lb 4 oz (78.6 kg)  08/13/20 175 lb 6 oz (79.5 kg)   Urine dipstick shows positive for RBC's and positive for leukocytes.  Micro exam: 6-10 WBC's per HPF, 0-2 RBC's per HPF, and few+ bacteria.   Health Maintenance Due  Topic Date Due   Pneumococcal Vaccine 73-28 Years old (1 - PCV) Never done   HIV Screening  Never done   Hepatitis C  Screening  Never done   PAP SMEAR-Modifier  Never done   COLONOSCOPY (Pts 45-54yrs Insurance coverage will need to be confirmed)  Never done    There are no preventive care reminders to display for this patient.   Lab Results  Component Value Date   TSH 0.814 09/10/2020   Lab Results  Component Value Date   WBC 10.2 08/05/2020   HGB 13.4 08/05/2020   HCT 42.3 08/05/2020   MCV 83.4 08/05/2020   PLT 193 08/05/2020   Lab Results  Component Value Date   NA 137 08/05/2020   K 4.0 08/05/2020   CO2 22 08/05/2020   GLUCOSE 99 08/05/2020   BUN 15 08/05/2020   CREATININE 0.91 08/05/2020   BILITOT 0.4 06/14/2018   ALKPHOS 74 06/14/2018   AST 17 06/14/2018   ALT 12 06/14/2018   PROT 6.9 06/14/2018   ALBUMIN 4.5 06/14/2018   CALCIUM 9.1 08/05/2020   ANIONGAP 11 08/05/2020   Lab Results  Component Value Date   CHOL 207 (H) 09/10/2020   Lab Results  Component Value Date   HDL 72 09/10/2020   Lab Results  Component Value Date   LDLCALC 116 (H) 09/10/2020   Lab Results  Component Value Date   TRIG 111 09/10/2020   Lab Results  Component Value Date   CHOLHDL 2.9 09/10/2020   No results found for: HGBA1C     Assessment & Plan:   Brooke Schroeder was seen today for dysuria.  Diagnoses and all orders for this visit:  Dysuria Will treat with antibiotics given bacteria and increase WBC on micro. Culture pending. Return to office for new or worsening symptoms, or if symptoms persist.   -     Urinalysis, Routine w reflex microscopic -     Urine Culture -     sulfamethoxazole-trimethoprim (BACTRIM DS) 800-160 MG tablet; Take 1 tablet by mouth 2 (two) times daily for 7 days.  The patient indicates understanding of these issues and agrees with the plan.  Geanie Berlin, FNP

## 2021-05-26 NOTE — Patient Instructions (Signed)
Urinary Tract Infection, Adult A urinary tract infection (UTI) is an infection of any part of the urinary tract. The urinary tract includes the kidneys, ureters, bladder, and urethra. These organs make, store, and get rid of urine in the body. An upper UTI affects the ureters and kidneys. A lower UTI affects the bladder and urethra. What are the causes? Most urinary tract infections are caused by bacteria in your genital area around your urethra, where urine leaves your body. These bacteria grow and cause inflammation of your urinary tract. What increases the risk? You are more likely to develop this condition if: You have a urinary catheter that stays in place. You are not able to control when you urinate or have a bowel movement (incontinence). You are female and you: Use a spermicide or diaphragm for birth control. Have low estrogen levels. Are pregnant. You have certain genes that increase your risk. You are sexually active. You take antibiotic medicines. You have a condition that causes your flow of urine to slow down, such as: An enlarged prostate, if you are female. Blockage in your urethra. A kidney stone. A nerve condition that affects your bladder control (neurogenic bladder). Not getting enough to drink, or not urinating often. You have certain medical conditions, such as: Diabetes. A weak disease-fighting system (immunesystem). Sickle cell disease. Gout. Spinal cord injury. What are the signs or symptoms? Symptoms of this condition include: Needing to urinate right away (urgency). Frequent urination. This may include small amounts of urine each time you urinate. Pain or burning with urination. Blood in the urine. Urine that smells bad or unusual. Trouble urinating. Cloudy urine. Vaginal discharge, if you are female. Pain in the abdomen or the lower back. You may also have: Vomiting or a decreased appetite. Confusion. Irritability or tiredness. A fever or  chills. Diarrhea. The first symptom in older adults may be confusion. In some cases, they may not have any symptoms until the infection has worsened. How is this diagnosed? This condition is diagnosed based on your medical history and a physical exam. You may also have other tests, including: Urine tests. Blood tests. Tests for STIs (sexually transmitted infections). If you have had more than one UTI, a cystoscopy or imaging studies may be done to determine the cause of the infections. How is this treated? Treatment for this condition includes: Antibiotic medicine. Over-the-counter medicines to treat discomfort. Drinking enough water to stay hydrated. If you have frequent infections or have other conditions such as a kidney stone, you may need to see a health care provider who specializes in the urinary tract (urologist). In rare cases, urinary tract infections can cause sepsis. Sepsis is a life-threatening condition that occurs when the body responds to an infection. Sepsis is treated in the hospital with IV antibiotics, fluids, and other medicines. Follow these instructions at home: Medicines Take over-the-counter and prescription medicines only as told by your health care provider. If you were prescribed an antibiotic medicine, take it as told by your health care provider. Do not stop using the antibiotic even if you start to feel better. General instructions Make sure you: Empty your bladder often and completely. Do not hold urine for long periods of time. Empty your bladder after sex. Wipe from front to back after urinating or having a bowel movement if you are female. Use each tissue only one time when you wipe. Drink enough fluid to keep your urine pale yellow. Keep all follow-up visits. This is important. Contact a health care provider   if: Your symptoms do not get better after 1-2 days. Your symptoms go away and then return. Get help right away if: You have severe pain in your  back or your lower abdomen. You have a fever or chills. You have nausea or vomiting. Summary A urinary tract infection (UTI) is an infection of any part of the urinary tract, which includes the kidneys, ureters, bladder, and urethra. Most urinary tract infections are caused by bacteria in your genital area. Treatment for this condition often includes antibiotic medicines. If you were prescribed an antibiotic medicine, take it as told by your health care provider. Do not stop using the antibiotic even if you start to feel better. Keep all follow-up visits. This is important. This information is not intended to replace advice given to you by your health care provider. Make sure you discuss any questions you have with your health care provider. Document Revised: 04/24/2020 Document Reviewed: 04/24/2020 Elsevier Patient Education  2022 Elsevier Inc.  

## 2021-05-28 LAB — URINE CULTURE

## 2021-06-04 ENCOUNTER — Other Ambulatory Visit: Payer: Self-pay | Admitting: Family Medicine

## 2021-06-04 DIAGNOSIS — F411 Generalized anxiety disorder: Secondary | ICD-10-CM

## 2021-10-04 ENCOUNTER — Ambulatory Visit: Payer: Commercial Managed Care - PPO | Admitting: Family Medicine

## 2021-10-04 ENCOUNTER — Encounter: Payer: Self-pay | Admitting: Family Medicine

## 2021-10-04 VITALS — BP 144/84 | HR 73 | Temp 97.8°F | Ht 63.0 in | Wt 176.8 lb

## 2021-10-04 DIAGNOSIS — E039 Hypothyroidism, unspecified: Secondary | ICD-10-CM | POA: Diagnosis not present

## 2021-10-04 DIAGNOSIS — F411 Generalized anxiety disorder: Secondary | ICD-10-CM

## 2021-10-04 DIAGNOSIS — E7800 Pure hypercholesterolemia, unspecified: Secondary | ICD-10-CM | POA: Diagnosis not present

## 2021-10-04 DIAGNOSIS — E559 Vitamin D deficiency, unspecified: Secondary | ICD-10-CM | POA: Diagnosis not present

## 2021-10-04 DIAGNOSIS — I10 Essential (primary) hypertension: Secondary | ICD-10-CM | POA: Diagnosis not present

## 2021-10-04 MED ORDER — BUPROPION HCL ER (XL) 150 MG PO TB24: 150.0000 mg | ORAL_TABLET | Freq: Every day | ORAL | 3 refills | Status: DC

## 2021-10-04 MED ORDER — HYDROCHLOROTHIAZIDE 12.5 MG PO TABS: 12.5000 mg | ORAL_TABLET | Freq: Every day | ORAL | 3 refills | Status: DC

## 2021-10-04 MED ORDER — HYDROXYZINE PAMOATE 25 MG PO CAPS: 25.0000 mg | ORAL_CAPSULE | Freq: Three times a day (TID) | ORAL | 1 refills | Status: DC | PRN

## 2021-10-04 NOTE — Progress Notes (Signed)
Subjective: CC:HTN w/ HLD, hypothyroidism PCP: Janora Norlander, DO EYC:XKGYJ Brooke Schroeder is a 46 y.o. female presenting to clinic today for:  1.  Hypertension with hyperlipidemia She admits that she is run out of her hydrochlorothiazide.  She was trying to do some weight loss and lifestyle modifications but blood pressure was noted to be 169/111 at work and so this prompted her to come in for renewals of her medicines.  She denies any headache, dizziness, chest pain or shortness of breath.  She has been seen for atypical chest pain in the past but this was thought to be secondary to panic attack.  See below  2.  Hypothyroidism Compliant with Synthroid 112 mcg daily.  No reports of tremor or change in bowel habits  3.  Anxiety disorder She is currently treated with Wellbutrin and she feels that this does help with her mood.  She had Vistaril which was helpful but she is run out of this.  She primarily cites her job as the place where she feels most anxious.  She has considered changing jobs but she is been with regular for 9 years and is reluctant to make a big change just yet.   ROS: Per HPI  Allergies  Allergen Reactions   Tetanus Toxoids     Had adverse reaction to TDap previously   Past Medical History:  Diagnosis Date   Thyroid disease    UTI (lower urinary tract infection)     Current Outpatient Medications:    buPROPion (WELLBUTRIN XL) 150 MG 24 hr tablet, Take 1 tablet (150 mg total) by mouth daily. Needs OV/ Physical, Disp: 90 tablet, Rfl: 0   hydrochlorothiazide (HYDRODIURIL) 12.5 MG tablet, Take 1 tablet (12.5 mg total) by mouth daily., Disp: 90 tablet, Rfl: 3   hydrOXYzine (VISTARIL) 25 MG capsule, Take 1 capsule (25 mg total) by mouth 3 (three) times daily as needed. (Patient not taking: Reported on 05/26/2021), Disp: 30 capsule, Rfl: 1   levothyroxine (SYNTHROID) 112 MCG tablet, Take 112 mcg by mouth every morning., Disp: , Rfl:    Multiple Vitamin (MULTIVITAMIN  WITH MINERALS) TABS tablet, Take 1 tablet by mouth daily., Disp: , Rfl:    UNABLE TO FIND, CBD gummies, Disp: , Rfl:  Social History   Socioeconomic History   Marital status: Married    Spouse name: Not on file   Number of children: Not on file   Years of education: Not on file   Highest education level: Not on file  Occupational History   Not on file  Tobacco Use   Smoking status: Never   Smokeless tobacco: Never  Substance and Sexual Activity   Alcohol use: No   Drug use: No   Sexual activity: Not on file  Other Topics Concern   Not on file  Social History Narrative   Not on file   Social Determinants of Health   Financial Resource Strain: Not on file  Food Insecurity: Not on file  Transportation Needs: Not on file  Physical Activity: Not on file  Stress: Not on file  Social Connections: Not on file  Intimate Partner Violence: Not on file   No family history on file.  Objective: Office vital signs reviewed. BP (!) 144/84    Pulse 73    Temp 97.8 F (36.6 C)    Ht 5' 3" (1.6 m)    Wt 176 lb 12.8 oz (80.2 kg)    SpO2 100%    BMI 31.32 kg/m  Physical Examination:  General: Awake, alert, well nourished, No acute distress HEENT: Normal; sclera white.  No exophthalmos.  No goiter. Cardio: regular rate and rhythm, S1S2 heard, no murmurs appreciated Pulm: clear to auscultation bilaterally, no wheezes, rhonchi or rales; normal work of breathing on room air Psych: Mood stable, speech normal  Depression screen [facility] 2/9 10/04/2021 05/26/2021 09/10/2020  Decreased Interest 0 0 0  Down, Depressed, Hopeless 0 0 0  PHQ - 2 Score 0 0 0  Altered sleeping - 0 0  Tired, decreased energy - 0 2  Change in appetite - 0 0  Feeling bad or failure about yourself  - 0 0  Trouble concentrating - 0 0  Moving slowly or fidgety/restless - 0 0  Suicidal thoughts - 0 0  PHQ-9 Score - 0 2  Difficult doing work/chores - Not difficult at all Not difficult at all   GAD 7 : Generalized  Anxiety Score 10/04/2021 05/26/2021 09/10/2020 08/13/2020  Nervous, Anxious, on Edge 0 0 1 3  Control/stop worrying 0 0 0 3  Worry too much - different things 0 0 0 3  Trouble relaxing 0 0 0 3  Restless 0 0 0 2  Easily annoyed or irritable 0 0 0 3  Afraid - awful might happen 0 0 0 3  Total GAD 7 Score 0 0 1 20  Anxiety Difficulty Not difficult at all Not difficult at all Not difficult at all Very difficult      Assessment/ Plan: 46 y.o. female   Primary hypertension - Plan: CMP14+EGFR, hydrochlorothiazide (HYDRODIURIL) 12.5 MG tablet  Pure hypercholesterolemia - Plan: CMP14+EGFR, LDL Cholesterol, Direct  Acquired hypothyroidism - Plan: TSH, T4, Free  Vitamin D deficiency - Plan: VITAMIN D 25 Hydroxy (Vit-D Deficiency, Fractures)  Generalized anxiety disorder - Plan: buPROPion (WELLBUTRIN XL) 150 MG 24 hr tablet, hydrOXYzine (VISTARIL) 25 MG capsule  Blood pressure not at goal.  Resume hydrochlorothiazide.  Would like to see her back in the next 3 months for blood pressure recheck and for annual exam  She is nonfasting.  We will check direct LDL only along with CMP  Check TSH, free T4.  We will renew Synthroid pending that result  Vitamin D deficiency noted in the past.  Recheck this level  Anxiety disorder is stable with current regimen.  Renewals have been sent  No orders of the defined types were placed in this encounter.  No orders of the defined types were placed in this encounter.    Janora Norlander, DO [place] [phone]

## 2021-10-04 NOTE — Patient Instructions (Signed)
You had labs performed today.  You will be contacted with the results of the labs once they are available, usually in the next 3 business days for routine lab work.  If you have an active my chart account, they will be released to your MyChart.  If you prefer to have these labs released to you via telephone, please let us know.     

## 2021-10-05 ENCOUNTER — Other Ambulatory Visit: Payer: Self-pay | Admitting: Family Medicine

## 2021-10-05 DIAGNOSIS — E039 Hypothyroidism, unspecified: Secondary | ICD-10-CM

## 2021-10-05 LAB — CMP14+EGFR
ALT: 15 IU/L (ref 0–32)
AST: 19 IU/L (ref 0–40)
Albumin/Globulin Ratio: 1.9 (ref 1.2–2.2)
Albumin: 4.4 g/dL (ref 3.8–4.8)
Alkaline Phosphatase: 79 IU/L (ref 44–121)
BUN/Creatinine Ratio: 18 (ref 9–23)
BUN: 16 mg/dL (ref 6–24)
Bilirubin Total: <0.2 mg/dL (ref 0.0–1.2)
CO2: 25 mmol/L (ref 20–29)
Calcium: 9 mg/dL (ref 8.7–10.2)
Chloride: 103 mmol/L (ref 96–106)
Creatinine, Ser: 0.91 mg/dL (ref 0.57–1.00)
Globulin, Total: 2.3 g/dL (ref 1.5–4.5)
Glucose: 89 mg/dL (ref 70–99)
Potassium: 4.3 mmol/L (ref 3.5–5.2)
Sodium: 139 mmol/L (ref 134–144)
Total Protein: 6.7 g/dL (ref 6.0–8.5)
eGFR: 78 mL/min/{1.73_m2} (ref >59)

## 2021-10-05 LAB — VITAMIN D 25 HYDROXY (VIT D DEFICIENCY, FRACTURES): Vit D, 25-Hydroxy: 25 ng/mL — ABNORMAL LOW (ref 30.0–100.0)

## 2021-10-05 LAB — LDL CHOLESTEROL, DIRECT: LDL Direct: 126 mg/dL — ABNORMAL HIGH (ref 0–99)

## 2021-10-05 LAB — T4, FREE: Free T4: 1.53 ng/dL (ref 0.82–1.77)

## 2021-10-05 LAB — TSH: TSH: 1.19 u[IU]/mL (ref 0.450–4.500)

## 2021-10-05 MED ORDER — LEVOTHYROXINE SODIUM 112 MCG PO TABS: 112.0000 ug | ORAL_TABLET | Freq: Every morning | ORAL | 3 refills | Status: DC

## 2021-12-10 ENCOUNTER — Encounter: Payer: Self-pay | Admitting: Family Medicine

## 2021-12-10 ENCOUNTER — Ambulatory Visit (INDEPENDENT_AMBULATORY_CARE_PROVIDER_SITE_OTHER): Payer: Commercial Managed Care - PPO | Admitting: Family Medicine

## 2021-12-10 VITALS — BP 139/86 | HR 101 | Temp 98.9°F | Ht 63.0 in | Wt 171.8 lb

## 2021-12-10 DIAGNOSIS — M5441 Lumbago with sciatica, right side: Secondary | ICD-10-CM | POA: Diagnosis not present

## 2021-12-10 DIAGNOSIS — E669 Obesity, unspecified: Secondary | ICD-10-CM

## 2021-12-10 DIAGNOSIS — G8929 Other chronic pain: Secondary | ICD-10-CM | POA: Insufficient documentation

## 2021-12-10 DIAGNOSIS — Z713 Dietary counseling and surveillance: Secondary | ICD-10-CM

## 2021-12-10 DIAGNOSIS — Z0001 Encounter for general adult medical examination with abnormal findings: Secondary | ICD-10-CM | POA: Diagnosis not present

## 2021-12-10 DIAGNOSIS — F5089 Other specified eating disorder: Secondary | ICD-10-CM

## 2021-12-10 DIAGNOSIS — Z0000 Encounter for general adult medical examination without abnormal findings: Secondary | ICD-10-CM

## 2021-12-10 DIAGNOSIS — E7800 Pure hypercholesterolemia, unspecified: Secondary | ICD-10-CM

## 2021-12-10 DIAGNOSIS — M2550 Pain in unspecified joint: Secondary | ICD-10-CM | POA: Diagnosis not present

## 2021-12-10 NOTE — Patient Instructions (Signed)
Come in for fasting labs.  We'll get your iron, arthritis panel and cholesterol ? ?Preventive Care 26-48 Years Old, Female ?Preventive care refers to lifestyle choices and visits with your health care provider that can promote health and wellness. Preventive care visits are also called wellness exams. ?What can I expect for my preventive care visit? ?Counseling ?Your health care provider may ask you questions about your: ?Medical history, including: ?Past medical problems. ?Family medical history. ?Pregnancy history. ?Current health, including: ?Menstrual cycle. ?Method of birth control. ?Emotional well-being. ?Home life and relationship well-being. ?Sexual activity and sexual health. ?Lifestyle, including: ?Alcohol, nicotine or tobacco, and drug use. ?Access to firearms. ?Diet, exercise, and sleep habits. ?Work and work Statistician. ?Sunscreen use. ?Safety issues such as seatbelt and bike helmet use. ?Physical exam ?Your health care provider will check your: ?Height and weight. These may be used to calculate your BMI (body mass index). BMI is a measurement that tells if you are at a healthy weight. ?Waist circumference. This measures the distance around your waistline. This measurement also tells if you are at a healthy weight and may help predict your risk of certain diseases, such as type 2 diabetes and high blood pressure. ?Heart rate and blood pressure. ?Body temperature. ?Skin for abnormal spots. ?What immunizations do I need? ?Vaccines are usually given at various ages, according to a schedule. Your health care provider will recommend vaccines for you based on your age, medical history, and lifestyle or other factors, such as travel or where you work. ?What tests do I need? ?Screening ?Your health care provider may recommend screening tests for certain conditions. This may include: ?Lipid and cholesterol levels. ?Diabetes screening. This is done by checking your blood sugar (glucose) after you have not eaten  for a while (fasting). ?Pelvic exam and Pap test. ?Hepatitis B test. ?Hepatitis C test. ?HIV (human immunodeficiency virus) test. ?STI (sexually transmitted infection) testing, if you are at risk. ?Lung cancer screening. ?Colorectal cancer screening. ?Mammogram. Talk with your health care provider about when you should start having regular mammograms. This may depend on whether you have a family history of breast cancer. ?BRCA-related cancer screening. This may be done if you have a family history of breast, ovarian, tubal, or peritoneal cancers. ?Bone density scan. This is done to screen for osteoporosis. ?Talk with your health care provider about your test results, treatment options, and if necessary, the need for more tests. ?Follow these instructions at home: ?Eating and drinking ? ?Eat a diet that includes fresh fruits and vegetables, whole grains, lean protein, and low-fat dairy products. ?Take vitamin and mineral supplements as recommended by your health care provider. ?Do not drink alcohol if: ?Your health care provider tells you not to drink. ?You are pregnant, may be pregnant, or are planning to become pregnant. ?If you drink alcohol: ?Limit how much you have to 0-1 drink a day. ?Know how much alcohol is in your drink. In the [place] one drink equals one 12 oz bottle of beer (355 mL), one 5 oz glass of wine (148 mL), or one 1? oz glass of hard liquor (44 mL). ?Lifestyle ?Brush your teeth every morning and night with fluoride toothpaste. Floss one time each day. ?Exercise for at least 30 minutes 5 or more days each week. ?Do not use any products that contain nicotine or tobacco. These products include cigarettes, chewing tobacco, and vaping devices, such as e-cigarettes. If you need help quitting, ask your health care provider. ?Do not use drugs. ?If you are  sexually active, practice safe sex. Use a condom or other form of protection to prevent STIs. ?If you do not wish to become pregnant, use a form of birth  control. If you plan to become pregnant, see your health care provider for a prepregnancy visit. ?Take aspirin only as told by your health care provider. Make sure that you understand how much to take and what form to take. Work with your health care provider to find out whether it is safe and beneficial for you to take aspirin daily. ?Find healthy ways to manage stress, such as: ?Meditation, yoga, or listening to music. ?Journaling. ?Talking to a trusted person. ?Spending time with friends and family. ?Minimize exposure to UV radiation to reduce your risk of skin cancer. ?Safety ?Always wear your seat belt while driving or riding in a vehicle. ?Do not drive: ?If you have been drinking alcohol. Do not ride with someone who has been drinking. ?When you are tired or distracted. ?While texting. ?If you have been using any mind-altering substances or drugs. ?Wear a helmet and other protective equipment during sports activities. ?If you have firearms in your house, make sure you follow all gun safety procedures. ?Seek help if you have been physically or sexually abused. ?What's next? ?Visit your health care provider once a year for an annual wellness visit. ?Ask your health care provider how often you should have your eyes and teeth checked. ?Stay up to date on all vaccines. ?This information is not intended to replace advice given to you by your health care provider. Make sure you discuss any questions you have with your health care provider. ?Document Revised: 03/10/2021 Document Reviewed: 03/10/2021 ?Elsevier Patient Education ? [place]. ? ?

## 2021-12-10 NOTE — Progress Notes (Signed)
? ?Brooke Schroeder is a 46 y.o. female presents to office today for annual physical exam examination.   ? ?Concerns today include: ?1.  Polyarthritis ?Patient reports that she has polyarthralgia.  No known history in the family of autoimmune disorder but there is may be a questionable autoimmune disorder in her sister.  She has really not use any OTC medications to treat thus far.  Her right hip does bother her. ? ?2.  Obesity ?Patient has been struggling with obesity.  She is interested in potentially starting some medication to help with this.  She tried lifestyle modification but has not been very successful ? ?3.  Ice eating ?Her husband is worried about iron deficiency because she has been eating ice quite a bit.  She reports she enjoys ice and water and therefore snacks on it.  Denies any abnormal bleeding. ? ?Diet: Fair  ?Last eye exam: Wears glasses ?Last colonoscopy: Needs ?Last mammogram: Up-to-date ?Last pap smear: Needs ?Refills needed today: All ?Immunizations needed: ?Immunization History  ?Administered Date(s) Administered  ? Influenza Inj Mdck Quad Pf 07/18/2017  ? ? ? ?Past Medical History:  ?Diagnosis Date  ? Thyroid disease   ? UTI (lower urinary tract infection)   ? ?Social History  ? ?Socioeconomic History  ? Marital status: Married  ?  Spouse name: Not on file  ? Number of children: Not on file  ? Years of education: Not on file  ? Highest education level: Not on file  ?Occupational History  ? Not on file  ?Tobacco Use  ? Smoking status: Never  ? Smokeless tobacco: Never  ?Substance and Sexual Activity  ? Alcohol use: No  ? Drug use: No  ? Sexual activity: Not on file  ?Other Topics Concern  ? Not on file  ?Social History Narrative  ? Not on file  ? ?Social Determinants of Health  ? ?Financial Resource Strain: Not on file  ?Food Insecurity: Not on file  ?Transportation Needs: Not on file  ?Physical Activity: Not on file  ?Stress: Not on file  ?Social Connections: Not on file  ?Intimate  Partner Violence: Not on file  ? ?History reviewed. No pertinent surgical history. ?History reviewed. No pertinent family history. ? ?Current Outpatient Medications:  ?  buPROPion (WELLBUTRIN XL) 150 MG 24 hr tablet, Take 1 tablet (150 mg total) by mouth daily., Disp: 90 tablet, Rfl: 3 ?  hydrochlorothiazide (HYDRODIURIL) 12.5 MG tablet, Take 1 tablet (12.5 mg total) by mouth daily., Disp: 90 tablet, Rfl: 3 ?  levothyroxine (SYNTHROID) 112 MCG tablet, Take 1 tablet (112 mcg total) by mouth every morning., Disp: 90 tablet, Rfl: 3 ?  Multiple Vitamin (MULTIVITAMIN WITH MINERALS) TABS tablet, Take 1 tablet by mouth daily., Disp: , Rfl:  ? ?Allergies  ?Allergen Reactions  ? Tetanus Toxoids   ?  Had adverse reaction to TDap previously  ?  ? ?ROS: ?Review of Systems ?Pertinent items noted in HPI and remainder of comprehensive ROS otherwise negative.   ? ?Physical exam ?BP 139/86   Pulse (!) 101   Temp 98.9 ?F (37.2 ?C) (Oral)   Ht 5\' 3"  (1.6 m)   Wt 171 lb 12.8 oz (77.9 kg)   SpO2 98%   BMI 30.43 kg/m?  ?General appearance: alert, cooperative, appears stated age, and no distress; obese. ?Head: Normocephalic, without obvious abnormality, atraumatic ?Eyes: negative findings: lids and lashes normal, conjunctivae and sclerae normal, corneas clear, and pupils equal, round, reactive to light and accomodation ?Ears: normal TM's and  external ear canals both ears ?Nose: Nares normal. Septum midline. Mucosa normal. No drainage or sinus tenderness. ?Throat: lips, mucosa, and tongue normal; teeth and gums normal ?Neck: no adenopathy, no carotid bruit, supple, symmetrical, trachea midline, and thyroid not enlarged, symmetric, no tenderness/mass/nodules ?Back: symmetric, no curvature. ROM normal. No CVA tenderness. ?Lungs: clear to auscultation bilaterally ?Heart: regular rate and rhythm, S1, S2 normal, no murmur, click, rub or gallop ?Abdomen: soft, non-tender; bowel sounds normal; no masses,  no organomegaly ?Extremities:  extremities normal, atraumatic, no cyanosis or edema ?Pulses: 2+ and symmetric ?Skin: Skin color, texture, turgor normal. No rashes or lesions ?Lymph nodes: Cervical, supraclavicular, and axillary nodes normal. ?Neurologic: Grossly normal ?Depression screen [facility] 2/9 12/10/2021 10/04/2021 05/26/2021  ?Decreased Interest 0 0 0  ?Down, Depressed, Hopeless 0 0 0  ?PHQ - 2 Score 0 0 0  ?Altered sleeping 1 - 0  ?Tired, decreased energy 1 - 0  ?Change in appetite 0 - 0  ?Feeling bad or failure about yourself  0 - 0  ?Trouble concentrating 0 - 0  ?Moving slowly or fidgety/restless 0 - 0  ?Suicidal thoughts 0 - 0  ?PHQ-9 Score 2 - 0  ?Difficult doing work/chores Not difficult at all - Not difficult at all  ? ? ? ?Assessment/ Plan: ?Brooke Schroeder here for annual physical exam.  ? ?Annual physical exam ? ?Obesity (BMI 30-39.9) ? ?Weight loss counseling, encounter for ? ?Polyarthralgia - Plan: Arthritis Panel ? ?Pure hypercholesterolemia - Plan: Lipid panel ? ?Pathologic ice eating - Plan: Iron, TIBC and Ferritin Panel ? ?Chronic right-sided low back pain with right-sided sciatica ? ?Needs to set up colonoscopy, Pap smear. ? ?We talked about weight loss and possibly using Wegovy.  She will check in with her insurance to see if Reginal Lutes is covered ? ?For her polyarthralgia we will check arthritis panel.  Though I suspect this may be osteoarthritic in nature and compounded by weight.  Would consider scheduled Tylenol and or oral NSAID. ? ?She will have fasting lipid performed ? ?Reported some pathologic ice eating so we will check iron levels ? ?For her chronic right-sided low back pain discussed home physical therapy.  If ongoing, plan for plain films ? ?Counseled on healthy lifestyle choices, including diet (rich in fruits, vegetables and lean meats and low in salt and simple carbohydrates) and exercise (at least 30 minutes of moderate physical activity daily). ? ?Patient to follow up in 1 year for annual exam or sooner if  needed. ? ?Riordan Walle M. Megon Kalina, DO ? ? ? ? ? ?

## 2021-12-17 ENCOUNTER — Other Ambulatory Visit: Payer: Commercial Managed Care - PPO

## 2021-12-17 DIAGNOSIS — F5089 Other specified eating disorder: Secondary | ICD-10-CM

## 2021-12-17 DIAGNOSIS — M2550 Pain in unspecified joint: Secondary | ICD-10-CM

## 2021-12-17 DIAGNOSIS — E7800 Pure hypercholesterolemia, unspecified: Secondary | ICD-10-CM

## 2021-12-18 ENCOUNTER — Other Ambulatory Visit: Payer: Self-pay | Admitting: Family Medicine

## 2021-12-18 DIAGNOSIS — D509 Iron deficiency anemia, unspecified: Secondary | ICD-10-CM

## 2021-12-18 LAB — ARTHRITIS PANEL
Basophils Absolute: 0 10*3/uL (ref 0.0–0.2)
Basos: 0 %
EOS (ABSOLUTE): 0.1 10*3/uL (ref 0.0–0.4)
Eos: 2 %
Hematocrit: 34.3 % (ref 34.0–46.6)
Hemoglobin: 10.9 g/dL — ABNORMAL LOW (ref 11.1–15.9)
Immature Grans (Abs): 0 10*3/uL (ref 0.0–0.1)
Immature Granulocytes: 0 %
Lymphocytes Absolute: 1.7 10*3/uL (ref 0.7–3.1)
Lymphs: 40 %
MCH: 23.5 pg — ABNORMAL LOW (ref 26.6–33.0)
MCHC: 31.8 g/dL (ref 31.5–35.7)
MCV: 74 fL — ABNORMAL LOW (ref 79–97)
Monocytes Absolute: 0.3 10*3/uL (ref 0.1–0.9)
Monocytes: 8 %
Neutrophils Absolute: 2.2 10*3/uL (ref 1.4–7.0)
Neutrophils: 50 %
Platelets: 211 10*3/uL (ref 150–450)
RBC: 4.64 x10E6/uL (ref 3.77–5.28)
RDW: 14.5 % (ref 11.7–15.4)
Rheumatoid fact SerPl-aCnc: <10 IU/mL (ref <14.0)
Sed Rate: 11 mm/hr (ref 0–32)
Uric Acid: 6.1 mg/dL (ref 2.6–6.2)
WBC: 4.3 10*3/uL (ref 3.4–10.8)

## 2021-12-18 LAB — LIPID PANEL
Chol/HDL Ratio: 3 ratio (ref 0.0–4.4)
Cholesterol, Total: 211 mg/dL — ABNORMAL HIGH (ref 100–199)
HDL: 71 mg/dL (ref >39)
LDL Chol Calc (NIH): 126 mg/dL — ABNORMAL HIGH (ref 0–99)
Triglycerides: 78 mg/dL (ref 0–149)
VLDL Cholesterol Cal: 14 mg/dL (ref 5–40)

## 2021-12-18 LAB — IRON,TIBC AND FERRITIN PANEL
Ferritin: 6 ng/mL — ABNORMAL LOW (ref 15–150)
Iron Saturation: 6 % — CL (ref 15–55)
Iron: 25 ug/dL — ABNORMAL LOW (ref 27–159)
Total Iron Binding Capacity: 425 ug/dL (ref 250–450)
UIBC: 400 ug/dL (ref 131–425)

## 2021-12-18 MED ORDER — IRON (FERROUS SULFATE) 325 (65 FE) MG PO TABS: 325.0000 mg | ORAL_TABLET | Freq: Two times a day (BID) | ORAL | 1 refills | Status: DC

## 2021-12-24 ENCOUNTER — Telehealth: Payer: Self-pay | Admitting: Family Medicine

## 2021-12-24 NOTE — Telephone Encounter (Signed)
Patient aware.

## 2021-12-24 NOTE — Telephone Encounter (Signed)
Patient to check on the results of her arthritis panel. Please call back and advise.  ?

## 2022-01-04 ENCOUNTER — Other Ambulatory Visit: Payer: Self-pay | Admitting: Family Medicine

## 2022-01-04 ENCOUNTER — Encounter: Payer: Self-pay | Admitting: Family Medicine

## 2022-01-04 DIAGNOSIS — I10 Essential (primary) hypertension: Secondary | ICD-10-CM

## 2022-01-04 DIAGNOSIS — E7800 Pure hypercholesterolemia, unspecified: Secondary | ICD-10-CM

## 2022-01-04 DIAGNOSIS — E669 Obesity, unspecified: Secondary | ICD-10-CM

## 2022-01-04 DIAGNOSIS — M2550 Pain in unspecified joint: Secondary | ICD-10-CM

## 2022-01-04 MED ORDER — WEGOVY 0.25 MG/0.5ML ~~LOC~~ SOAJ: 0.2500 mg | SUBCUTANEOUS | 0 refills | Status: DC

## 2022-01-04 MED ORDER — WEGOVY 0.5 MG/0.5ML ~~LOC~~ SOAJ: 0.5000 mg | SUBCUTANEOUS | 0 refills | Status: DC

## 2022-01-04 MED ORDER — WEGOVY 1.7 MG/0.75ML ~~LOC~~ SOAJ: 1.7000 mg | SUBCUTANEOUS | 0 refills | Status: DC

## 2022-01-04 MED ORDER — WEGOVY 1 MG/0.5ML ~~LOC~~ SOAJ: 1.0000 mg | SUBCUTANEOUS | 0 refills | Status: DC

## 2022-01-05 ENCOUNTER — Telehealth: Payer: Self-pay | Admitting: *Deleted

## 2022-01-05 NOTE — Telephone Encounter (Signed)
Your prior authorization for Reginal Lutes has been approved! ?[facility] ? approved from 01/05/2022 to 08/07/2022. ?

## 2022-01-05 NOTE — Telephone Encounter (Signed)
Anise Iqbal (Key: [id number]) ?Rx #: [id number] ?Wegovy 0.25MG /0.5ML auto-injectors ? ?Sent to plan  ? ?Wait for Questions ?[employer] NCPDP 2017 typically responds with questions in less than 15 minutes, but may take up to 24 hours. ?

## 2022-01-31 ENCOUNTER — Other Ambulatory Visit: Payer: Self-pay | Admitting: Family Medicine

## 2022-01-31 DIAGNOSIS — M2550 Pain in unspecified joint: Secondary | ICD-10-CM

## 2022-01-31 DIAGNOSIS — E7800 Pure hypercholesterolemia, unspecified: Secondary | ICD-10-CM

## 2022-01-31 DIAGNOSIS — I10 Essential (primary) hypertension: Secondary | ICD-10-CM

## 2022-01-31 DIAGNOSIS — E669 Obesity, unspecified: Secondary | ICD-10-CM

## 2022-01-31 MED ORDER — WEGOVY 1.7 MG/0.75ML ~~LOC~~ SOAJ: 1.7000 mg | SUBCUTANEOUS | 0 refills | Status: DC

## 2022-01-31 MED ORDER — WEGOVY 1 MG/0.5ML ~~LOC~~ SOAJ: 1.0000 mg | SUBCUTANEOUS | 0 refills | Status: DC

## 2022-03-01 ENCOUNTER — Other Ambulatory Visit: Payer: Commercial Managed Care - PPO

## 2022-03-01 DIAGNOSIS — D509 Iron deficiency anemia, unspecified: Secondary | ICD-10-CM

## 2022-03-02 LAB — CBC
Hematocrit: 42.5 % (ref 34.0–46.6)
Hemoglobin: 14.3 g/dL (ref 11.1–15.9)
MCH: 28.5 pg (ref 26.6–33.0)
MCHC: 33.6 g/dL (ref 31.5–35.7)
MCV: 85 fL (ref 79–97)
Platelets: 172 10*3/uL (ref 150–450)
RBC: 5.01 x10E6/uL (ref 3.77–5.28)
RDW: 18.3 % — ABNORMAL HIGH (ref 11.7–15.4)
WBC: 5.4 10*3/uL (ref 3.4–10.8)

## 2022-03-02 LAB — IRON: Iron: 142 ug/dL (ref 27–159)

## 2022-03-02 LAB — FERRITIN: Ferritin: 32 ng/mL (ref 15–150)

## 2022-03-07 ENCOUNTER — Ambulatory Visit: Payer: Commercial Managed Care - PPO | Admitting: Family Medicine

## 2022-03-07 ENCOUNTER — Encounter: Payer: Self-pay | Admitting: Family Medicine

## 2022-03-07 VITALS — BP 136/85 | HR 73 | Temp 97.6°F | Ht 63.0 in | Wt 166.6 lb

## 2022-03-07 DIAGNOSIS — M2550 Pain in unspecified joint: Secondary | ICD-10-CM

## 2022-03-07 DIAGNOSIS — D509 Iron deficiency anemia, unspecified: Secondary | ICD-10-CM

## 2022-03-07 DIAGNOSIS — E669 Obesity, unspecified: Secondary | ICD-10-CM

## 2022-03-07 NOTE — Progress Notes (Signed)
Subjective: CC: Follow-up anemia, obesity PCP: Brooke Ip, DO LOV:FIEPP Brooke Schroeder is a 46 y.o. female presenting to clinic today for:  1.  Anemia Found to have iron deficiency anemia with hemoglobin down to 10.9.  Most recent hemoglobin check showed resolution of anemia after iron supplementation up to 14.3.  She unfortunately did have some issues with her thyroid levels because she was not utilizing the iron 4 hours separated from her thyroid med.  She is work with endocrinology to rectify this  2.  obesity Was doing well on the [facility] and have gotten down to 161 pounds.  Unfortunately, there is been a back order and she has not been able to progress to the 1 mg dosing so she is abandon the medication.  She is up to 166 pounds but overall down from 173 pounds.  She is not sure if she wants to restart medicine even though it helped because she wants to make sure that she can maintain her weight wants discontinuation of the med.  She is going to really try and get vigorous about exercise in efforts to reduce her weight   ROS: Per HPI  Allergies  Allergen Reactions   Tetanus Toxoids     Had adverse reaction to TDap previously   Past Medical History:  Diagnosis Date   Thyroid disease    UTI (lower urinary tract infection)     Current Outpatient Medications:    buPROPion (WELLBUTRIN XL) 150 MG 24 hr tablet, Take 1 tablet (150 mg total) by mouth daily., Disp: 90 tablet, Rfl: 3   hydrochlorothiazide (HYDRODIURIL) 12.5 MG tablet, Take 1 tablet (12.5 mg total) by mouth daily., Disp: 90 tablet, Rfl: 3   Iron, Ferrous Sulfate, 325 (65 Fe) MG TABS, Take 325 mg by mouth in the morning and at bedtime., Disp: 180 tablet, Rfl: 1   levothyroxine (SYNTHROID) 112 MCG tablet, Take 1 tablet (112 mcg total) by mouth every morning., Disp: 90 tablet, Rfl: 3   Multiple Vitamin (MULTIVITAMIN WITH MINERALS) TABS tablet, Take 1 tablet by mouth daily., Disp: , Rfl:    Semaglutide-Weight Management  (WEGOVY) 1.7 MG/0.75ML SOAJ, Inject 1.7 mg into the skin every 7 (seven) days. Month#4 (Patient not taking: Reported on 03/07/2022), Disp: 3 mL, Rfl: 0 Social History   Socioeconomic History   Marital status: Married    Spouse name: Not on file   Number of children: Not on file   Years of education: Not on file   Highest education level: Not on file  Occupational History   Not on file  Tobacco Use   Smoking status: Never   Smokeless tobacco: Never  Substance and Sexual Activity   Alcohol use: No   Drug use: No   Sexual activity: Not on file  Other Topics Concern   Not on file  Social History Narrative   Not on file   Social Determinants of Health   Financial Resource Strain: Not on file  Food Insecurity: Not on file  Transportation Needs: Not on file  Physical Activity: Not on file  Stress: Not on file  Social Connections: Not on file  Intimate Partner Violence: Not on file   History reviewed. No pertinent family history.  Objective: Office vital signs reviewed. BP 136/85   Pulse 73   Temp 97.6 F (36.4 C)   Ht 5\' 3"  (1.6 m)   Wt 166 lb 9.6 oz (75.6 kg)   SpO2 100%   BMI 29.51 kg/m   Physical Examination:  General: Awake, alert, well nourished, No acute distress HEENT: Sclera white.  Moist mucous membranes Cardio: regular rate and rhythm, S1S2 heard, no murmurs appreciated Pulm: clear to auscultation bilaterally, no wheezes, rhonchi or rales; normal work of breathing on room air MSK: No gross joint deformity.  She is ambulating independently  Assessment/ Plan: 46 y.o. female   Iron deficiency anemia, unspecified iron deficiency anemia type  Obesity (BMI 30-39.9)  Polyarthralgia  Anemia significantly improved after iron use.  Continue iron supplementation.  We discussed consideration for Saxenda and lieu of Wegovy since there is a backorder but she would like to hold off on this and see how much she can get off through diet and exercise alone.  She will  contact me if she decides to proceed with Saxenda  Continues to have some polyarthralgia but no evidence of autoimmune disease precipitating this.   No orders of the defined types were placed in this encounter.  No orders of the defined types were placed in this encounter.    Brooke Ip, DO [facility] [phone]

## 2022-08-26 ENCOUNTER — Other Ambulatory Visit: Payer: Self-pay | Admitting: Obstetrics and Gynecology

## 2022-08-26 DIAGNOSIS — R928 Other abnormal and inconclusive findings on diagnostic imaging of breast: Secondary | ICD-10-CM

## 2022-09-07 ENCOUNTER — Ambulatory Visit
Admission: RE | Admit: 2022-09-07 | Discharge: 2022-09-07 | Disposition: A | Discharge status: Home / Self Care | Payer: Commercial Managed Care - PPO | Source: Ambulatory Visit | Attending: Obstetrics and Gynecology | Admitting: Obstetrics and Gynecology

## 2022-09-07 ENCOUNTER — Other Ambulatory Visit: Payer: Self-pay | Admitting: Obstetrics and Gynecology

## 2022-09-07 DIAGNOSIS — R928 Other abnormal and inconclusive findings on diagnostic imaging of breast: Secondary | ICD-10-CM

## 2022-09-07 DIAGNOSIS — N6489 Other specified disorders of breast: Secondary | ICD-10-CM

## 2022-09-07 DIAGNOSIS — N6310 Unspecified lump in the right breast, unspecified quadrant: Secondary | ICD-10-CM

## 2022-09-17 LAB — COLOGUARD: COLOGUARD: NEGATIVE

## 2022-09-28 ENCOUNTER — Other Ambulatory Visit: Payer: Self-pay | Admitting: Family Medicine

## 2022-09-28 DIAGNOSIS — I10 Essential (primary) hypertension: Secondary | ICD-10-CM

## 2022-09-30 ENCOUNTER — Other Ambulatory Visit: Payer: Self-pay | Admitting: Family Medicine

## 2022-09-30 DIAGNOSIS — F411 Generalized anxiety disorder: Secondary | ICD-10-CM

## 2022-10-06 ENCOUNTER — Encounter: Payer: Self-pay | Admitting: Family Medicine

## 2022-10-06 NOTE — Telephone Encounter (Signed)
Pt is scheduled to be seen for this tomorrow. Can provider/nurse call patient and advise her on what she can do to help lower BP in the meantime until her appt?

## 2022-10-07 ENCOUNTER — Ambulatory Visit: Payer: Commercial Managed Care - PPO | Admitting: Family

## 2022-10-07 ENCOUNTER — Encounter: Payer: Self-pay | Admitting: Family

## 2022-10-07 ENCOUNTER — Telehealth: Payer: Self-pay | Admitting: Family Medicine

## 2022-10-07 VITALS — BP 129/74 | HR 97 | Temp 98.0°F | Ht 63.0 in | Wt 170.0 lb

## 2022-10-07 DIAGNOSIS — I10 Essential (primary) hypertension: Secondary | ICD-10-CM

## 2022-10-07 MED ORDER — HYDROCHLOROTHIAZIDE 25 MG PO TABS: 25.0000 mg | ORAL_TABLET | Freq: Every day | ORAL | 3 refills | Status: DC

## 2022-10-07 NOTE — Patient Instructions (Signed)
Hypertension, Adult High blood pressure (hypertension) is when the force of blood pumping through the arteries is too strong. The arteries are the blood vessels that carry blood from the heart throughout the body. Hypertension forces the heart to work harder to pump blood and may cause arteries to become narrow or stiff. Untreated or uncontrolled hypertension can lead to a heart attack, heart failure, a stroke, kidney disease, and other problems. A blood pressure reading consists of a higher number over a lower number. Ideally, your blood pressure should be below 120/80. The first ("top") number is called the systolic pressure. It is a measure of the pressure in your arteries as your heart beats. The second ("bottom") number is called the diastolic pressure. It is a measure of the pressure in your arteries as the heart relaxes. What are the causes? The exact cause of this condition is not known. There are some conditions that result in high blood pressure. What increases the risk? Certain factors may make you more likely to develop high blood pressure. Some of these risk factors are under your control, including: Smoking. Not getting enough exercise or physical activity. Being overweight. Having too much fat, sugar, calories, or salt (sodium) in your diet. Drinking too much alcohol. Other risk factors include: Having a personal history of heart disease, diabetes, high cholesterol, or kidney disease. Stress. Having a family history of high blood pressure and high cholesterol. Having obstructive sleep apnea. Age. The risk increases with age. What are the signs or symptoms? High blood pressure may not cause symptoms. Very high blood pressure (hypertensive crisis) may cause: Headache. Fast or irregular heartbeats (palpitations). Shortness of breath. Nosebleed. Nausea and vomiting. Vision changes. Severe chest pain, dizziness, and seizures. How is this diagnosed? This condition is diagnosed by  measuring your blood pressure while you are seated, with your arm resting on a flat surface, your legs uncrossed, and your feet flat on the floor. The cuff of the blood pressure monitor will be placed directly against the skin of your upper arm at the level of your heart. Blood pressure should be measured at least twice using the same arm. Certain conditions can cause a difference in blood pressure between your right and left arms. If you have a high blood pressure reading during one visit or you have normal blood pressure with other risk factors, you may be asked to: Return on a different day to have your blood pressure checked again. Monitor your blood pressure at home for 1 week or longer. If you are diagnosed with hypertension, you may have other blood or imaging tests to help your health care provider understand your overall risk for other conditions. How is this treated? This condition is treated by making healthy lifestyle changes, such as eating healthy foods, exercising more, and reducing your alcohol intake. You may be referred for counseling on a healthy diet and physical activity. Your health care provider may prescribe medicine if lifestyle changes are not enough to get your blood pressure under control and if: Your systolic blood pressure is above 130. Your diastolic blood pressure is above 80. Your personal target blood pressure may vary depending on your medical conditions, your age, and other factors. Follow these instructions at home: Eating and drinking  Eat a diet that is high in fiber and potassium, and low in sodium, added sugar, and fat. An example of this eating plan is called the DASH diet. DASH stands for Dietary Approaches to Stop Hypertension. To eat this way: Eat   plenty of fresh fruits and vegetables. Try to fill one half of your plate at each meal with fruits and vegetables. Eat whole grains, such as whole-wheat pasta, brown rice, or whole-grain bread. Fill about one  fourth of your plate with whole grains. Eat or drink low-fat dairy products, such as skim milk or low-fat yogurt. Avoid fatty cuts of meat, processed or cured meats, and poultry with skin. Fill about one fourth of your plate with lean proteins, such as fish, chicken without skin, beans, eggs, or tofu. Avoid pre-made and processed foods. These tend to be higher in sodium, added sugar, and fat. Reduce your daily sodium intake. Many people with hypertension should eat less than 1,500 mg of sodium a day. Do not drink alcohol if: Your health care provider tells you not to drink. You are pregnant, may be pregnant, or are planning to become pregnant. If you drink alcohol: Limit how much you have to: 0-1 drink a day for women. 0-2 drinks a day for men. Know how much alcohol is in your drink. In the [place] one drink equals one 12 oz bottle of beer (355 mL), one 5 oz glass of wine (148 mL), or one 1 oz glass of hard liquor (44 mL). Lifestyle  Work with your health care provider to maintain a healthy body weight or to lose weight. Ask what an ideal weight is for you. Get at least 30 minutes of exercise that causes your heart to beat faster (aerobic exercise) most days of the week. Activities may include walking, swimming, or biking. Include exercise to strengthen your muscles (resistance exercise), such as Pilates or lifting weights, as part of your weekly exercise routine. Try to do these types of exercises for 30 minutes at least 3 days a week. Do not use any products that contain nicotine or tobacco. These products include cigarettes, chewing tobacco, and vaping devices, such as e-cigarettes. If you need help quitting, ask your health care provider. Monitor your blood pressure at home as told by your health care provider. Keep all follow-up visits. This is important. Medicines Take over-the-counter and prescription medicines only as told by your health care provider. Follow directions carefully. Blood  pressure medicines must be taken as prescribed. Do not skip doses of blood pressure medicine. Doing this puts you at risk for problems and can make the medicine less effective. Ask your health care provider about side effects or reactions to medicines that you should watch for. Contact a health care provider if you: Think you are having a reaction to a medicine you are taking. Have headaches that keep coming back (recurring). Feel dizzy. Have swelling in your ankles. Have trouble with your vision. Get help right away if you: Develop a severe headache or confusion. Have unusual weakness or numbness. Feel faint. Have severe pain in your chest or abdomen. Vomit repeatedly. Have trouble breathing. These symptoms may be an emergency. Get help right away. Call 911. Do not wait to see if the symptoms will go away. Do not drive yourself to the hospital. Summary Hypertension is when the force of blood pumping through your arteries is too strong. If this condition is not controlled, it may put you at risk for serious complications. Your personal target blood pressure may vary depending on your medical conditions, your age, and other factors. For most people, a normal blood pressure is less than 120/80. Hypertension is treated with lifestyle changes, medicines, or a combination of both. Lifestyle changes include losing weight, eating a healthy,   low-sodium diet, exercising more, and limiting alcohol. This information is not intended to replace advice given to you by your health care provider. Make sure you discuss any questions you have with your health care provider. Document Revised: 07/20/2021 Document Reviewed: 07/20/2021 Elsevier Patient Education  2023 Elsevier Inc.  

## 2022-10-07 NOTE — Progress Notes (Signed)
Subjective:    Patient ID: Brooke Schroeder, female    DOB: [birth year], 47 y.o.   MRN: [id number]  Chief Complaint  Patient presents with   Hypertension   PT presents to the office today with elevated BP. She has been checking at home her with a wrist BP and it has been 143/95, 166/96,151/100, and 141/94.  However, her BP today is at goal.   She brings her BP wrist monitor in today and it reads 136/97.   She is taking HCTZ 12.5 mg, this AM she took 25 mg.  Hypertension This is a chronic problem. The current episode started more than 1 year ago. The problem has been resolved since onset. The problem is controlled. Pertinent negatives include no malaise/fatigue or peripheral edema. Risk factors for coronary artery disease include dyslipidemia. Past treatments include diuretics. The current treatment provides moderate improvement. There is no history of heart failure.      Review of Systems  Constitutional:  Negative for malaise/fatigue.  All other systems reviewed and are negative.      Objective:   Physical Exam Vitals reviewed.  Constitutional:      General: She is not in acute distress.    Appearance: She is well-developed.  HENT:     Head: Normocephalic and atraumatic.     Right Ear: Tympanic membrane normal.     Left Ear: Tympanic membrane normal.  Eyes:     Pupils: Pupils are equal, round, and reactive to light.  Neck:     Thyroid: No thyromegaly.  Cardiovascular:     Rate and Rhythm: Normal rate and regular rhythm.     Heart sounds: Normal heart sounds. No murmur heard. Pulmonary:     Effort: Pulmonary effort is normal. No respiratory distress.     Breath sounds: Normal breath sounds. No wheezing.  Abdominal:     General: Bowel sounds are normal. There is no distension.     Palpations: Abdomen is soft.     Tenderness: There is no abdominal tenderness.  Musculoskeletal:        General: No tenderness. Normal range of motion.     Cervical back: Normal range of  motion and neck supple.  Skin:    General: Skin is warm and dry.  Neurological:     Mental Status: She is alert and oriented to person, place, and time.     Cranial Nerves: No cranial nerve deficit.     Deep Tendon Reflexes: Reflexes are normal and symmetric.  Psychiatric:        Behavior: Behavior normal.        Thought Content: Thought content normal.        Judgment: Judgment normal.       BP 129/74   Pulse 97   Temp 98 F (36.7 C) (Temporal)   Ht 5\' 3"  (1.6 m)   Wt 170 lb (77.1 kg)   SpO2 99%   BMI 30.11 kg/m      Assessment & Plan:  Brooke Schroeder comes in today with chief complaint of Hypertension   Diagnosis and orders addressed:  1. Primary hypertension Will continue HCTZ 25 mg from 12.5 mg -Daily blood pressure log given with instructions on how to fill out and told to bring to next visit -Dash diet information given -Exercise encouraged - Stress Management  -Continue current meds -RTO in  1 month with PCP  - hydrochlorothiazide (HYDRODIURIL) 25 MG tablet; Take 1 tablet (25 mg total) by mouth daily.  Dispense: 90 tablet; Refill: 3 - BMP8+EGFR    Evelina Dun, FNP

## 2022-10-08 LAB — BMP8+EGFR
BUN/Creatinine Ratio: 14 (ref 9–23)
BUN: 16 mg/dL (ref 6–24)
CO2: 24 mmol/L (ref 20–29)
Calcium: 9.1 mg/dL (ref 8.7–10.2)
Chloride: 101 mmol/L (ref 96–106)
Creatinine, Ser: 1.12 mg/dL — ABNORMAL HIGH (ref 0.57–1.00)
Glucose: 93 mg/dL (ref 70–99)
Potassium: 4.1 mmol/L (ref 3.5–5.2)
Sodium: 139 mmol/L (ref 134–144)
eGFR: 61 mL/min/{1.73_m2} (ref >59)

## 2022-10-23 ENCOUNTER — Other Ambulatory Visit: Payer: Self-pay | Admitting: Family Medicine

## 2022-10-23 DIAGNOSIS — F411 Generalized anxiety disorder: Secondary | ICD-10-CM

## 2022-11-09 ENCOUNTER — Encounter: Payer: Self-pay | Admitting: Family Medicine

## 2022-11-09 ENCOUNTER — Ambulatory Visit: Payer: Commercial Managed Care - PPO | Admitting: Family Medicine

## 2022-11-09 VITALS — BP 137/84 | HR 84 | Temp 98.6°F | Ht 63.0 in | Wt 171.0 lb

## 2022-11-09 DIAGNOSIS — I10 Essential (primary) hypertension: Secondary | ICD-10-CM | POA: Diagnosis not present

## 2022-11-09 DIAGNOSIS — E669 Obesity, unspecified: Secondary | ICD-10-CM

## 2022-11-09 NOTE — Progress Notes (Signed)
Subjective: CC: Follow-up hypertension PCP: Brooke Norlander, DO [id number] Brooke Schroeder is a 47 y.o. female presenting to clinic today for:  1.  Hypertension Patient here for 4-week follow-up on hypertension.  She notes that she was at work a few weeks ago and had blood pressures that were extremely high.  These were checked several times before she sought further evaluation.  She notes by the time she was seen here her blood pressure had normalized they went ahead and proceeded with an advanced dose of hydrochlorothiazide because patient was seeing blood pressures that were somewhat elevated at home as well.  She brings in her wrist cuff today and notes that blood pressures are still running around 0000000 to [id number] systolic over [id number] to 0000000 diastolic.  She does not feel like she was having a panic attack or any other concerning features that may have precipitated the elevation in blood pressure.  She does not that she consumes any excessive amounts of salt and did not have any changes in lifestyle prior to the episode.   ROS: Per HPI  Allergies  Allergen Reactions   Tetanus Toxoids     Had adverse reaction to TDap previously   Past Medical History:  Diagnosis Date   Thyroid disease    UTI (lower urinary tract infection)     Current Outpatient Medications:    buPROPion (WELLBUTRIN XL) 150 MG 24 hr tablet, Take 1 tablet (150 mg total) by mouth daily., Disp: 90 tablet, Rfl: 0   hydrochlorothiazide (HYDRODIURIL) 25 MG tablet, Take 1 tablet (25 mg total) by mouth daily., Disp: 90 tablet, Rfl: 3   Iron, Ferrous Sulfate, 325 (65 Fe) MG TABS, Take 325 mg by mouth in the morning and at bedtime. (Patient not taking: Reported on 10/07/2022), Disp: 180 tablet, Rfl: 1   levothyroxine (SYNTHROID) 112 MCG tablet, Take 1 tablet (112 mcg total) by mouth every morning., Disp: 90 tablet, Rfl: 3   Multiple Vitamin (MULTIVITAMIN WITH MINERALS) TABS tablet, Take 1 tablet by mouth daily., Disp: , Rfl:     Semaglutide-Weight Management (WEGOVY) 1.7 MG/0.75ML SOAJ, Inject 1.7 mg into the skin every 7 (seven) days. Month#4 (Patient not taking: Reported on 10/07/2022), Disp: 3 mL, Rfl: 0 Social History   Socioeconomic History   Marital status: Married    Spouse name: Not on file   Number of children: Not on file   Years of education: Not on file   Highest education level: Not on file  Occupational History   Not on file  Tobacco Use   Smoking status: Never   Smokeless tobacco: Never  Substance and Sexual Activity   Alcohol use: No   Drug use: No   Sexual activity: Not on file  Other Topics Concern   Not on file  Social History Narrative   Not on file   Social Determinants of Health   Financial Resource Strain: Not on file  Food Insecurity: Not on file  Transportation Needs: Not on file  Physical Activity: Not on file  Stress: Not on file  Social Connections: Not on file  Intimate Partner Violence: Not on file   No family history on file.  Objective: Office vital signs reviewed. BP 137/84   Pulse 84   Temp 98.6 F (37 C)   Ht 5' 3"$  (1.6 m)   Wt 171 lb (77.6 kg)   SpO2 98%   BMI 30.29 kg/m   Physical Examination:  General: Awake, alert, well nourished, No acute distress HEENT:  sclera white, MMM Cardio: regular rate and rhythm, S1S2 heard, no murmurs appreciated Pulm: clear to auscultation bilaterally, no wheezes, rhonchi or rales; normal work of breathing on room air Extremities: warm, well perfused, No edema, cyanosis or clubbing; +2 pulses bilaterally    Assessment/ Plan: 47 y.o. female   Primary hypertension  Obesity (BMI 30-39.9)  We discussed multiple possible etiologies of this spike in blood pressure.  Including, transient hypertensive episode due to an unidentified consumption of excess salt etc.  It does not sound like it was panic or stress related.  I also considered things like renal artery stenosis and other metabolic abnormalities.  She did have a  mild elevation in serum creatinine so we may consider repeating BMP and even adding a renal artery ultrasound if she continues to see spikes in blood pressure and/or have other issues.  I think that maybe some of her weight gain is why she is seeing some of these elevations as well.  She was on a GLP and unfortunately had to stop because she could not find it in stock anywhere.  We might consider revisiting this type of medication as it does help with cardiovascular issues as well.  I asked that she get her wrist blood pressure monitor swapped out for an elbow monitor as there was about a 10 point discrepancy in systolic blood pressures between her blood pressure monitor and ours.  Will plan to follow-up in the next several weeks, sooner if concerns arise   No orders of the defined types were placed in this encounter.  No orders of the defined types were placed in this encounter.   Brooke Norlander, DO [place] [phone]

## 2022-12-27 ENCOUNTER — Other Ambulatory Visit: Payer: Self-pay | Admitting: Family Medicine

## 2022-12-27 DIAGNOSIS — E039 Hypothyroidism, unspecified: Secondary | ICD-10-CM

## 2022-12-27 NOTE — Telephone Encounter (Signed)
Pt says that she sees Dr Chalmers Cater for this medication refill. Pt says that she has upcoming apt with Dr Chalmers Cater in June. [facility] refill to wrong office.

## 2022-12-27 NOTE — Telephone Encounter (Signed)
Please make sure she is scheduled within the next 3 months for thyroid.  Will send 90 days only until seen

## 2023-01-22 ENCOUNTER — Other Ambulatory Visit: Payer: Self-pay | Admitting: Family Medicine

## 2023-01-22 DIAGNOSIS — F411 Generalized anxiety disorder: Secondary | ICD-10-CM

## 2023-03-10 ENCOUNTER — Ambulatory Visit
Admission: RE | Admit: 2023-03-10 | Discharge: 2023-03-10 | Disposition: A | Discharge status: Home / Self Care | Payer: Commercial Managed Care - PPO | Source: Ambulatory Visit | Attending: Obstetrics and Gynecology | Admitting: Obstetrics and Gynecology

## 2023-03-10 ENCOUNTER — Ambulatory Visit: Admission: RE | Admit: 2023-03-10 | Payer: Commercial Managed Care - PPO | Source: Ambulatory Visit

## 2023-03-10 DIAGNOSIS — N6489 Other specified disorders of breast: Secondary | ICD-10-CM

## 2023-03-15 ENCOUNTER — Other Ambulatory Visit: Payer: Self-pay | Admitting: Obstetrics and Gynecology

## 2023-03-15 DIAGNOSIS — N6489 Other specified disorders of breast: Secondary | ICD-10-CM

## 2023-05-16 ENCOUNTER — Encounter: Payer: Self-pay | Admitting: Family Medicine

## 2023-05-16 ENCOUNTER — Ambulatory Visit: Payer: Commercial Managed Care - PPO | Admitting: Family Medicine

## 2023-05-16 VITALS — BP 148/84 | HR 90 | Temp 98.5°F | Ht 63.0 in | Wt 167.0 lb

## 2023-05-16 DIAGNOSIS — M5441 Lumbago with sciatica, right side: Secondary | ICD-10-CM

## 2023-05-16 MED ORDER — METHOCARBAMOL 750 MG PO TABS: 750.0000 mg | ORAL_TABLET | Freq: Three times a day (TID) | ORAL | 1 refills | Status: AC | PRN

## 2023-05-16 MED ORDER — MELOXICAM 15 MG PO TABS: 15.0000 mg | ORAL_TABLET | Freq: Every day | ORAL | 0 refills | Status: AC | PRN

## 2023-05-16 MED ORDER — METHYLPREDNISOLONE ACETATE 80 MG/ML IJ SUSP: 80.0000 mg | Freq: Once | INTRAMUSCULAR | Status: AC

## 2023-05-16 NOTE — Progress Notes (Signed)
Subjective: CC: Right back pain PCP: Raliegh Ip, DO WCB:JSEGB Brooke Schroeder is a 47 y.o. female presenting to clinic today for:  1.  Low back pain Patient reports couple months history of right-sided low back pain.  Initially just ordered in the right gluteal region but now traverses the right anterior thigh and into the medial thigh.  She went to the chiropractor, Dr. Chase Picket, and has had a treatment there so far.  He she was told that she had a pinched nerve at L4 and L5.  They recommended 3 times weekly treatment for 4 weeks.  She voices some concern because it is $50 per visit and she just wants to make sure that this is the best course of action for her.  She notes that she has tried Aleve to treat but it really did not help at all.  She reports no numbness, tingling or weakness of the right lower extremity but she does admit that certain positions certainly make it worse.  She specifically states that getting up from a seated position can cause increased pain.  Sometimes pain is really severe.   ROS: Per HPI  Allergies  Allergen Reactions   Tetanus Toxoids     Had adverse reaction to TDap previously   Past Medical History:  Diagnosis Date   Thyroid disease    UTI (lower urinary tract infection)     Current Outpatient Medications:    buPROPion (WELLBUTRIN XL) 150 MG 24 hr tablet, TAKE 1 TABLET BY MOUTH EVERY DAY, Disp: 90 tablet, Rfl: 3   cholecalciferol (VITAMIN D3) 25 MCG (1000 UNIT) tablet, Take 1,000 Units by mouth daily., Disp: , Rfl:    hydrochlorothiazide (HYDRODIURIL) 25 MG tablet, Take 1 tablet (25 mg total) by mouth daily., Disp: 90 tablet, Rfl: 3   levothyroxine (SYNTHROID) 112 MCG tablet, Take 1 tablet (112 mcg total) by mouth every morning. MUST HAVE OV FOR FURTHER FILLS, Disp: 90 tablet, Rfl: 0   meloxicam (MOBIC) 15 MG tablet, Take 1 tablet (15 mg total) by mouth daily as needed for pain., Disp: 90 tablet, Rfl: 0   methocarbamol (ROBAXIN-750) 750 MG tablet,  Take 1 tablet (750 mg total) by mouth every 8 (eight) hours as needed for muscle spasms., Disp: 30 tablet, Rfl: 1   Multiple Vitamin (MULTIVITAMIN WITH MINERALS) TABS tablet, Take 1 tablet by mouth daily., Disp: , Rfl:   Current Facility-Administered Medications:    methylPREDNISolone acetate (DEPO-MEDROL) injection 80 mg, 80 mg, Intramuscular, Once,  Social History   Socioeconomic History   Marital status: Married    Spouse name: Not on file   Number of children: Not on file   Years of education: Not on file   Highest education level: Not on file  Occupational History   Not on file  Tobacco Use   Smoking status: Never   Smokeless tobacco: Never  Substance and Sexual Activity   Alcohol use: No   Drug use: No   Sexual activity: Not on file  Other Topics Concern   Not on file  Social History Narrative   Not on file   Social Determinants of Health   Financial Resource Strain: Not on file  Food Insecurity: Not on file  Transportation Needs: Not on file  Physical Activity: Not on file  Stress: Not on file  Social Connections: Unknown (02/08/2022)   Received from [facility]   Social Network    Social Network: Not on file  Intimate Partner Violence: Unknown (12/31/2021)  Received from [facility]   HITS    Physically Hurt: Not on file    Insult or Talk Down To: Not on file    Threaten Physical Harm: Not on file    Scream or Curse: Not on file   History reviewed. No pertinent family history.  Objective: Office vital signs reviewed. BP (!) 148/84   Pulse 90   Temp 98.5 F (36.9 C)   Ht 5\' 3"  (1.6 m)   Wt 167 lb (75.8 kg)   LMP 05/02/2023   SpO2 99%   BMI 29.58 kg/m   Physical Examination:  General: Awake, alert, well nourished, No acute distress HEENT: sclera white, MMM MSK: Gait is antalgic but independent  Lumbar spine: No midline tenderness palpation.  She does have tenderness palpation over the SI joint on the right.  Negative straight leg raise.   Negative FADIR and negative FABER.  Light touch and station grossly intact bilaterally.  Strength is 4/5 in a deduction and extension of the right hip  Assessment/ Plan: 47 y.o. female   Acute right-sided low back pain with right-sided sciatica - Plan: methylPREDNISolone acetate (DEPO-MEDROL) injection 80 mg, meloxicam (MOBIC) 15 MG tablet, methocarbamol (ROBAXIN-750) 750 MG tablet  Agree that she likely has a pinched nerve in the L4 on L5 pattern based on her history.  I am not overly suspicious for herniated disc given negative straight leg raise.  I think it is reasonable for her to continue chiropractic medicine but would absolutely avoid use of high velocity treatment as this may exacerbate symptoms.  We discussed consideration for formal physical therapy but she is going to try the chiropractic medicine first.  I placed her on an oral NSAID and discussed only use of this oral NSAID and no others over-the-counter.  Muscle relaxant also provided.  Caution sedation.  Start with only bedtime use.  She was given a corticosteroid injection given degree of discomfort today.  We will plan to reassess in the next 4 to 6 weeks.  If she decides that she would like physical therapy prior to that time, she will contact me and I will be glad to place order.  Anticipate that if symptoms or not resolving however she will need MRI.   Raliegh Ip, DO [facility] [phone]

## 2023-09-11 ENCOUNTER — Ambulatory Visit
Admission: RE | Admit: 2023-09-11 | Discharge: 2023-09-11 | Disposition: A | Discharge status: Home / Self Care | Payer: Commercial Managed Care - PPO | Source: Ambulatory Visit | Attending: Obstetrics and Gynecology | Admitting: Obstetrics and Gynecology

## 2023-09-11 DIAGNOSIS — N6489 Other specified disorders of breast: Secondary | ICD-10-CM

## 2023-10-07 ENCOUNTER — Other Ambulatory Visit: Payer: Self-pay | Admitting: Family Medicine

## 2023-10-07 DIAGNOSIS — I10 Essential (primary) hypertension: Secondary | ICD-10-CM

## 2023-11-11 ENCOUNTER — Other Ambulatory Visit: Payer: Self-pay | Admitting: Family

## 2023-11-11 DIAGNOSIS — I10 Essential (primary) hypertension: Secondary | ICD-10-CM

## 2024-01-30 ENCOUNTER — Other Ambulatory Visit: Payer: Self-pay | Admitting: Family Medicine

## 2024-01-30 ENCOUNTER — Encounter: Payer: Self-pay | Admitting: Family Medicine

## 2024-01-30 DIAGNOSIS — F411 Generalized anxiety disorder: Secondary | ICD-10-CM

## 2024-01-30 NOTE — Telephone Encounter (Signed)
 Lmtcb. Letter mailed

## 2024-01-30 NOTE — Telephone Encounter (Signed)
Needs appt with pcp
